# Patient Record
Sex: Female | Born: 1935 | Race: White | Hispanic: No | Smoking: Former smoker
Health system: Southern US, Community
[De-identification: ages and names within clinical notes are randomized; demographics above are authoritative.]

## PROBLEM LIST (undated history)

## (undated) DIAGNOSIS — N179 Acute kidney failure, unspecified: Secondary | ICD-10-CM

## (undated) DIAGNOSIS — N183 Chronic kidney disease, stage 3 unspecified: Secondary | ICD-10-CM

## (undated) DIAGNOSIS — K5790 Diverticulosis of intestine, part unspecified, without perforation or abscess without bleeding: Secondary | ICD-10-CM

## (undated) DIAGNOSIS — D649 Anemia, unspecified: Secondary | ICD-10-CM

## (undated) DIAGNOSIS — K289 Gastrojejunal ulcer, unspecified as acute or chronic, without hemorrhage or perforation: Secondary | ICD-10-CM

## (undated) DIAGNOSIS — K7460 Unspecified cirrhosis of liver: Secondary | ICD-10-CM

## (undated) DIAGNOSIS — K7682 Hepatic encephalopathy: Secondary | ICD-10-CM

## (undated) DIAGNOSIS — Z9289 Personal history of other medical treatment: Secondary | ICD-10-CM

## (undated) DIAGNOSIS — I10 Essential (primary) hypertension: Secondary | ICD-10-CM

## (undated) DIAGNOSIS — K219 Gastro-esophageal reflux disease without esophagitis: Secondary | ICD-10-CM

## (undated) DIAGNOSIS — K7290 Hepatic failure, unspecified without coma: Secondary | ICD-10-CM

## (undated) DIAGNOSIS — E119 Type 2 diabetes mellitus without complications: Secondary | ICD-10-CM

## (undated) DIAGNOSIS — D509 Iron deficiency anemia, unspecified: Secondary | ICD-10-CM

## (undated) DIAGNOSIS — E1142 Type 2 diabetes mellitus with diabetic polyneuropathy: Secondary | ICD-10-CM

## (undated) DIAGNOSIS — K760 Fatty (change of) liver, not elsewhere classified: Secondary | ICD-10-CM

## (undated) DIAGNOSIS — K31819 Angiodysplasia of stomach and duodenum without bleeding: Secondary | ICD-10-CM

## (undated) HISTORY — PX: ABDOMINAL HYSTERECTOMY: SHX81

## (undated) HISTORY — PX: BLADDER SUSPENSION: SHX72

## (undated) HISTORY — PX: CHOLECYSTECTOMY: SHX55

---

## 2011-08-15 ENCOUNTER — Ambulatory Visit (INDEPENDENT_AMBULATORY_CARE_PROVIDER_SITE_OTHER): Payer: PRIVATE HEALTH INSURANCE | Admitting: Ophthalmology

## 2011-08-15 DIAGNOSIS — H35039 Hypertensive retinopathy, unspecified eye: Secondary | ICD-10-CM

## 2011-08-15 DIAGNOSIS — E1139 Type 2 diabetes mellitus with other diabetic ophthalmic complication: Secondary | ICD-10-CM

## 2011-08-15 DIAGNOSIS — H43819 Vitreous degeneration, unspecified eye: Secondary | ICD-10-CM

## 2011-08-15 DIAGNOSIS — E11319 Type 2 diabetes mellitus with unspecified diabetic retinopathy without macular edema: Secondary | ICD-10-CM

## 2011-08-15 DIAGNOSIS — I10 Essential (primary) hypertension: Secondary | ICD-10-CM

## 2011-09-12 DIAGNOSIS — K760 Fatty (change of) liver, not elsewhere classified: Secondary | ICD-10-CM | POA: Insufficient documentation

## 2011-09-12 DIAGNOSIS — E785 Hyperlipidemia, unspecified: Secondary | ICD-10-CM | POA: Insufficient documentation

## 2011-09-12 DIAGNOSIS — I10 Essential (primary) hypertension: Secondary | ICD-10-CM | POA: Diagnosis present

## 2012-08-15 ENCOUNTER — Ambulatory Visit (INDEPENDENT_AMBULATORY_CARE_PROVIDER_SITE_OTHER): Payer: PRIVATE HEALTH INSURANCE | Admitting: Ophthalmology

## 2012-11-29 DIAGNOSIS — F4321 Adjustment disorder with depressed mood: Secondary | ICD-10-CM | POA: Diagnosis present

## 2013-06-13 ENCOUNTER — Emergency Department (HOSPITAL_BASED_OUTPATIENT_CLINIC_OR_DEPARTMENT_OTHER): Payer: 59

## 2013-06-13 ENCOUNTER — Encounter (HOSPITAL_BASED_OUTPATIENT_CLINIC_OR_DEPARTMENT_OTHER): Payer: Self-pay | Admitting: Emergency Medicine

## 2013-06-13 ENCOUNTER — Emergency Department (HOSPITAL_BASED_OUTPATIENT_CLINIC_OR_DEPARTMENT_OTHER)
Admission: EM | Admit: 2013-06-13 | Discharge: 2013-06-13 | Disposition: A | Discharge status: Home / Self Care | Payer: 59 | Attending: Emergency Medicine | Admitting: Emergency Medicine

## 2013-06-13 DIAGNOSIS — D649 Anemia, unspecified: Secondary | ICD-10-CM | POA: Insufficient documentation

## 2013-06-13 DIAGNOSIS — J40 Bronchitis, not specified as acute or chronic: Secondary | ICD-10-CM

## 2013-06-13 DIAGNOSIS — N183 Chronic kidney disease, stage 3 unspecified: Secondary | ICD-10-CM | POA: Insufficient documentation

## 2013-06-13 DIAGNOSIS — R05 Cough: Secondary | ICD-10-CM

## 2013-06-13 DIAGNOSIS — Z8719 Personal history of other diseases of the digestive system: Secondary | ICD-10-CM | POA: Insufficient documentation

## 2013-06-13 DIAGNOSIS — Z79899 Other long term (current) drug therapy: Secondary | ICD-10-CM | POA: Insufficient documentation

## 2013-06-13 DIAGNOSIS — I129 Hypertensive chronic kidney disease with stage 1 through stage 4 chronic kidney disease, or unspecified chronic kidney disease: Secondary | ICD-10-CM | POA: Insufficient documentation

## 2013-06-13 DIAGNOSIS — E119 Type 2 diabetes mellitus without complications: Secondary | ICD-10-CM | POA: Insufficient documentation

## 2013-06-13 DIAGNOSIS — R071 Chest pain on breathing: Secondary | ICD-10-CM | POA: Insufficient documentation

## 2013-06-13 DIAGNOSIS — J209 Acute bronchitis, unspecified: Secondary | ICD-10-CM | POA: Insufficient documentation

## 2013-06-13 HISTORY — DX: Fatty (change of) liver, not elsewhere classified: K76.0

## 2013-06-13 HISTORY — DX: Gastrojejunal ulcer, unspecified as acute or chronic, without hemorrhage or perforation: K28.9

## 2013-06-13 HISTORY — DX: Essential (primary) hypertension: I10

## 2013-06-13 HISTORY — DX: Diverticulosis of intestine, part unspecified, without perforation or abscess without bleeding: K57.90

## 2013-06-13 HISTORY — DX: Chronic kidney disease, stage 3 unspecified: N18.30

## 2013-06-13 HISTORY — DX: Anemia, unspecified: D64.9

## 2013-06-13 HISTORY — DX: Personal history of other medical treatment: Z92.89

## 2013-06-13 HISTORY — DX: Type 2 diabetes mellitus without complications: E11.9

## 2013-06-13 HISTORY — DX: Chronic kidney disease, stage 3 (moderate): N18.3

## 2013-06-13 LAB — BASIC METABOLIC PANEL
Calcium: 9.6 mg/dL (ref 8.4–10.5)
Creatinine, Ser: 1.3 mg/dL — ABNORMAL HIGH (ref 0.50–1.10)
GFR calc Af Amer: 45 mL/min — ABNORMAL LOW (ref >90)
GFR calc non Af Amer: 39 mL/min — ABNORMAL LOW (ref >90)
Glucose, Bld: 188 mg/dL — ABNORMAL HIGH (ref 70–99)
Potassium: 4.7 mEq/L (ref 3.5–5.1)
Sodium: 138 mEq/L (ref 135–145)

## 2013-06-13 LAB — CBC
Hemoglobin: 10.3 g/dL — ABNORMAL LOW (ref 12.0–15.0)
MCH: 28.1 pg (ref 26.0–34.0)
MCHC: 31 g/dL (ref 30.0–36.0)
MCV: 90.5 fL (ref 78.0–100.0)
RDW: 17.3 % — ABNORMAL HIGH (ref 11.5–15.5)

## 2013-06-13 MED ORDER — IOHEXOL 350 MG/ML SOLN
100.0000 mL | Freq: Once | INTRAVENOUS | Status: AC | PRN
  Administered 2013-06-13: 100 mL via INTRAVENOUS

## 2013-06-13 MED ORDER — DEXTROMETHORPHAN-GUAIFENESIN 10-100 MG/5ML PO LIQD: 5.0000 mL | Freq: Four times a day (QID) | ORAL | Status: DC | PRN

## 2013-06-13 MED ORDER — SODIUM CHLORIDE 0.9 % IV BOLUS (SEPSIS)
1000.0000 mL | Freq: Once | INTRAVENOUS | Status: AC
  Administered 2013-06-13: 1000 mL via INTRAVENOUS

## 2013-06-13 MED ORDER — DOXYCYCLINE HYCLATE 100 MG PO CAPS: 100.0000 mg | ORAL_CAPSULE | Freq: Two times a day (BID) | ORAL | Status: DC

## 2013-06-13 NOTE — ED Provider Notes (Signed)
CSN: [id number]     Arrival date & time 06/13/13  1546 History  This chart was scribed for Dagmar Hait, MD by Clydene Laming, ED Scribe. This patient was seen in room MH11/MH11 and the patient's care was started at 4:40 PM. Chief Complaint  Patient presents with  . Cough  . Fever  . Pleurisy   Patient is a 78 y.o. female presenting with cough and fever. The history is provided by the patient. No language interpreter was used.  Cough Cough characteristics:  Dry Severity:  Moderate Onset quality:  Gradual Duration:  3 weeks Timing:  Constant Progression:  Unchanged Chronicity:  Recurrent Smoker: no   Relieved by:  Nothing Worsened by:  Nothing tried Ineffective treatments:  Decongestant and cough suppressants Associated symptoms: fever   Risk factors: no recent travel   Fever Associated symptoms: cough    HPI Comments: Cynthia Stanton is a 78 y.o. female who presents to the Emergency Department complaining of a constant cough onset three weeks ago with associated fever and congestion. Pt reports pain in the left rib associated with taking deep breaths onset today. Pt is not coughing up blood.She does not have a known hx of blood clots. She reports a fever everyday after getting out of the hospital 3 weeks ago. Pt checker her temperature today and it showed 99.1. Pt vomited one night two weeks ago. No known hx of blood clot. Pt is using vicks with mild relief.  Pt has hx of gout and was taken off meds and now has pain in the feet.  Pt was seen at [facility] 3 weeks ago to quaternize her stomach for anemia 3 weeks ago. Symptoms began days after.  Past Medical History  Diagnosis Date  . Anemia   . Ulcer of the stomach and intestine   . History of blood transfusion   . Hypertension   . Diabetes mellitus without complication   . Fatty liver   . Kidney disease, chronic, stage III (GFR 30-59 ml/min)   . Diverticulosis    Past Surgical History  Procedure Laterality Date   . Abdominal hysterectomy    . Cholecystectomy    . Bladder suspension     No family history on file. History  Substance Use Topics  . Smoking status: Never Smoker   . Smokeless tobacco: Not on file  . Alcohol Use: Not on file   OB History   Grav Para Term Preterm Abortions TAB SAB Ect Mult Living                 Review of Systems  Constitutional: Positive for fever.  Respiratory: Positive for cough.   All other systems reviewed and are negative.    Allergies  Amlodipine besy-benazepril hcl; Biaxin; Erythromycin; Flagyl; Lansoprazole; Nifedipine; Norvasc; Omeprazole; Phenergan; and Prednisolone  Home Medications   Current Outpatient Rx  Name  Route  Sig  Dispense  Refill  . allopurinol (ZYLOPRIM) 100 MG tablet   Oral   Take 100 mg by mouth daily.         Marland Kitchen ALPRAZolam (XANAX) 0.5 MG tablet   Oral   Take 0.5 mg by mouth 3 (three) times daily as needed for anxiety.         . calcium carbonate (OS-CAL) 600 MG TABS tablet   Oral   Take 600 mg by mouth 2 (two) times daily with a meal.         . chlorthalidone (HYGROTON) 50 MG tablet  Oral   Take 50 mg by mouth daily.         . cholestyramine light (PREVALITE) 4 G packet   Oral   Take 4 g by mouth 2 (two) times daily.         . colchicine 0.6 MG tablet   Oral   Take 0.6 mg by mouth daily.         Marland Kitchen diltiazem (CARDIZEM CD) 180 MG 24 hr capsule   Oral   Take 180 mg by mouth daily.         Marland Kitchen esomeprazole (NEXIUM) 40 MG capsule   Oral   Take 40 mg by mouth daily at 12 noon.         . ferrous fumarate (HEMOCYTE - 106 MG FE) 325 (106 FE) MG TABS tablet   Oral   Take 1 tablet by mouth.         . fluticasone (FLONASE) 50 MCG/ACT nasal spray   Each Nare   Place into both nostrils daily.         Marland Kitchen lactulose (CEPHULAC) 10 G packet   Oral   Take 10 g by mouth 3 (three) times daily.         Marland Kitchen losartan (COZAAR) 100 MG tablet   Oral   Take 100 mg by mouth daily.         . Multiple  Vitamin (MULTIVITAMIN) capsule   Oral   Take 1 capsule by mouth daily.         . nebivolol (BYSTOLIC) 10 MG tablet   Oral   Take 20 mg by mouth daily.         . pravastatin (PRAVACHOL) 80 MG tablet   Oral   Take 80 mg by mouth daily.         . sertraline (ZOLOFT) 100 MG tablet   Oral   Take 100 mg by mouth daily.         Marland Kitchen spironolactone (ALDACTONE) 25 MG tablet   Oral   Take 25 mg by mouth daily.          BP 158/51  Pulse 69  Temp(Src) 98.4 F (36.9 C) (Oral)  Resp 16  Ht 5' 9.5" (1.765 m)  Wt 158 lb (71.668 kg)  BMI 23.01 kg/m2  SpO2 98% Physical Exam  Nursing note and vitals reviewed. Constitutional: She is oriented to person, place, and time. She appears well-developed and well-nourished. No distress.  HENT:  Head: Normocephalic and atraumatic.  Eyes: EOM are normal.  Neck: Normal range of motion.  Cardiovascular: Normal rate, regular rhythm and normal heart sounds.   Pulmonary/Chest: Effort normal and breath sounds normal. No respiratory distress. She has no wheezes. She exhibits no tenderness.  Abdominal: Soft. She exhibits no distension. There is no tenderness.  Musculoskeletal: Normal range of motion.  Neurological: She is alert and oriented to person, place, and time.  Skin: Skin is warm and dry. No rash noted. No pallor.  Psychiatric: She has a normal mood and affect. Judgment normal.    ED Course  Procedures (including critical care time) DIAGNOSTIC STUDIES: Oxygen Saturation is 98% on RA, normal by my interpretation.    COORDINATION OF CARE: 4:45 PM- Discussed treatment plan with pt at bedside. Pt verbalized understanding and agreement with plan.   Labs Review Labs Reviewed  CBC - Abnormal; Notable for the following:    RBC 3.67 (*)    Hemoglobin 10.3 (*)    HCT 33.2 (*)  RDW 17.3 (*)    All other components within normal limits  BASIC METABOLIC PANEL - Abnormal; Notable for the following:    Glucose, Bld 188 (*)    Creatinine,  Ser 1.30 (*)    GFR calc non Af Amer 39 (*)    GFR calc Af Amer 45 (*)    All other components within normal limits   Imaging Review Dg Chest 2 View  06/13/2013   CLINICAL DATA:  78 year old female with cough, left-sided chest pain and fever.  EXAM: CHEST  2 VIEW  COMPARISON:  05/25/2013 and prior chest radiographs dating back to 01/04/2010  FINDINGS: The cardiomediastinal silhouette is unremarkable.  Lungs are clear.  There is no evidence of focal airspace disease, pulmonary edema, suspicious pulmonary nodule/mass, pleural effusion, or pneumothorax. No acute bony abnormalities are identified.  IMPRESSION: No active cardiopulmonary disease.   Electronically Signed   By: Laveda Abbe M.D.   On: 06/13/2013 17:10   Ct Angio Chest Pe W/cm &/or Wo Cm  06/13/2013   CLINICAL DATA:  Cough, fever, congestion.  EXAM: CT ANGIOGRAPHY CHEST WITH CONTRAST  TECHNIQUE: Multidetector CT imaging of the chest was performed using the standard protocol during bolus administration of intravenous contrast. Multiplanar CT image reconstructions including MIPs were obtained to evaluate the vascular anatomy.  CONTRAST:  OMNIPAQUE IOHEXOL 350 MG/ML SOLN  COMPARISON:  None.  FINDINGS: Satisfactory opacification of pulmonary arteries noted, and there is no evidence of pulmonary emboli. Adequate contrast opacification of the thoracic aorta with no evidence of dissection, aneurysm, or stenosis. There is classic 3-vessel brachiocephalic arch anatomy without proximal stenosis. No pleural or pericardial effusion. Patchy coronary and aortic calcifications. No hilar or mediastinal adenopathy. Azygos fissure. No pneumothorax. Lungs clear. Early spondylitic changes in the mid thoracic spine. There is a trace amount of perihepatic ascites. Remainder visualized upper abdomen unremarkable.  Review of the MIP images confirms the above findings.  IMPRESSION: 1. Negative for acute PE or thoracic aortic dissection. 2. Atherosclerosis, including  aortic and coronary artery disease. Please note that although the presence of coronary artery calcium documents the presence of coronary artery disease, the severity of this disease and any potential stenosis cannot be assessed on this non-gated CT examination. Assessment for potential risk factor modification, dietary therapy or pharmacologic therapy may be warranted, if clinically indicated.   Electronically Signed   By: Oley Balm M.D.   On: 06/13/2013 18:31    EKG Interpretation   None       MDM   1. Cough   2. Bronchitis    I personally performed the services described in this documentation, which was scribed in my presence. The recorded information has been reviewed and is accurate.  78 roll female presents with fever, cough pleuritic pain. No documented fevers at home. She's had cough since her EGD with blood transfusion 3 weeks ago out of her original hospital. She states cough has become mildly productive. She states some chills, but no true documented fevers. Here she is afebrile vital signs are stable. He also reports some left-sided chest pain that is worse with deep breathing that began today. On exam, she has no palpable chest tenderness. With recent hospital stay, concern for PE. PE scan is negative. Chest x-ray is normal. Labs are normal. Will treat with doxycycline for bronchitis and given cough medicine. Stable for discharge   Dagmar Hait, MD 06/13/13 2325

## 2013-06-13 NOTE — ED Notes (Signed)
Pt having fever, cough and rib pain with coughing.  Pt states it was going on for about one month.

## 2013-07-04 ENCOUNTER — Other Ambulatory Visit: Payer: Self-pay | Admitting: Internal Medicine

## 2013-08-17 ENCOUNTER — Ambulatory Visit (INDEPENDENT_AMBULATORY_CARE_PROVIDER_SITE_OTHER): Payer: Medicare Other | Admitting: Ophthalmology

## 2013-08-17 DIAGNOSIS — E1165 Type 2 diabetes mellitus with hyperglycemia: Secondary | ICD-10-CM

## 2013-08-17 DIAGNOSIS — D3130 Benign neoplasm of unspecified choroid: Secondary | ICD-10-CM

## 2013-08-17 DIAGNOSIS — E11319 Type 2 diabetes mellitus with unspecified diabetic retinopathy without macular edema: Secondary | ICD-10-CM

## 2013-08-17 DIAGNOSIS — I10 Essential (primary) hypertension: Secondary | ICD-10-CM

## 2013-08-17 DIAGNOSIS — H43819 Vitreous degeneration, unspecified eye: Secondary | ICD-10-CM

## 2013-08-17 DIAGNOSIS — H35039 Hypertensive retinopathy, unspecified eye: Secondary | ICD-10-CM

## 2013-08-17 DIAGNOSIS — E1139 Type 2 diabetes mellitus with other diabetic ophthalmic complication: Secondary | ICD-10-CM

## 2013-10-28 DIAGNOSIS — N183 Chronic kidney disease, stage 3 unspecified: Secondary | ICD-10-CM | POA: Diagnosis present

## 2013-10-28 DIAGNOSIS — R109 Unspecified abdominal pain: Secondary | ICD-10-CM | POA: Diagnosis present

## 2013-10-28 DIAGNOSIS — K31819 Angiodysplasia of stomach and duodenum without bleeding: Secondary | ICD-10-CM | POA: Insufficient documentation

## 2013-10-28 DIAGNOSIS — E1149 Type 2 diabetes mellitus with other diabetic neurological complication: Secondary | ICD-10-CM | POA: Diagnosis present

## 2013-10-28 DIAGNOSIS — I6529 Occlusion and stenosis of unspecified carotid artery: Secondary | ICD-10-CM | POA: Insufficient documentation

## 2014-08-20 ENCOUNTER — Ambulatory Visit (INDEPENDENT_AMBULATORY_CARE_PROVIDER_SITE_OTHER): Payer: Medicare Other | Admitting: Ophthalmology

## 2015-01-24 DIAGNOSIS — D6959 Other secondary thrombocytopenia: Secondary | ICD-10-CM | POA: Diagnosis present

## 2015-01-24 DIAGNOSIS — I480 Paroxysmal atrial fibrillation: Secondary | ICD-10-CM | POA: Insufficient documentation

## 2015-11-28 DIAGNOSIS — E1142 Type 2 diabetes mellitus with diabetic polyneuropathy: Secondary | ICD-10-CM | POA: Diagnosis present

## 2015-12-06 DIAGNOSIS — K7682 Hepatic encephalopathy: Secondary | ICD-10-CM | POA: Insufficient documentation

## 2015-12-06 DIAGNOSIS — K7290 Hepatic failure, unspecified without coma: Secondary | ICD-10-CM | POA: Insufficient documentation

## 2015-12-21 DIAGNOSIS — D509 Iron deficiency anemia, unspecified: Secondary | ICD-10-CM | POA: Diagnosis present

## 2016-04-04 ENCOUNTER — Encounter (HOSPITAL_BASED_OUTPATIENT_CLINIC_OR_DEPARTMENT_OTHER): Payer: Self-pay

## 2016-04-04 ENCOUNTER — Inpatient Hospital Stay (HOSPITAL_BASED_OUTPATIENT_CLINIC_OR_DEPARTMENT_OTHER)
Admission: EM | Admit: 2016-04-04 | Discharge: 2016-04-08 | DRG: 074 | Disposition: A | Discharge status: Home Health | Payer: Medicare Other | Attending: Family Medicine | Admitting: Family Medicine

## 2016-04-04 DIAGNOSIS — Z888 Allergy status to other drugs, medicaments and biological substances status: Secondary | ICD-10-CM

## 2016-04-04 DIAGNOSIS — Z79899 Other long term (current) drug therapy: Secondary | ICD-10-CM

## 2016-04-04 DIAGNOSIS — F32A Depression, unspecified: Secondary | ICD-10-CM | POA: Diagnosis present

## 2016-04-04 DIAGNOSIS — Z882 Allergy status to sulfonamides status: Secondary | ICD-10-CM

## 2016-04-04 DIAGNOSIS — Z833 Family history of diabetes mellitus: Secondary | ICD-10-CM

## 2016-04-04 DIAGNOSIS — Z7984 Long term (current) use of oral hypoglycemic drugs: Secondary | ICD-10-CM

## 2016-04-04 DIAGNOSIS — K3184 Gastroparesis: Secondary | ICD-10-CM | POA: Diagnosis present

## 2016-04-04 DIAGNOSIS — E1149 Type 2 diabetes mellitus with other diabetic neurological complication: Secondary | ICD-10-CM | POA: Diagnosis present

## 2016-04-04 DIAGNOSIS — Z8249 Family history of ischemic heart disease and other diseases of the circulatory system: Secondary | ICD-10-CM

## 2016-04-04 DIAGNOSIS — I10 Essential (primary) hypertension: Secondary | ICD-10-CM | POA: Diagnosis present

## 2016-04-04 DIAGNOSIS — R197 Diarrhea, unspecified: Secondary | ICD-10-CM

## 2016-04-04 DIAGNOSIS — R112 Nausea with vomiting, unspecified: Secondary | ICD-10-CM | POA: Diagnosis present

## 2016-04-04 DIAGNOSIS — N183 Chronic kidney disease, stage 3 unspecified: Secondary | ICD-10-CM | POA: Diagnosis present

## 2016-04-04 DIAGNOSIS — R262 Difficulty in walking, not elsewhere classified: Secondary | ICD-10-CM

## 2016-04-04 DIAGNOSIS — D631 Anemia in chronic kidney disease: Secondary | ICD-10-CM | POA: Diagnosis present

## 2016-04-04 DIAGNOSIS — D6959 Other secondary thrombocytopenia: Secondary | ICD-10-CM | POA: Diagnosis present

## 2016-04-04 DIAGNOSIS — I130 Hypertensive heart and chronic kidney disease with heart failure and stage 1 through stage 4 chronic kidney disease, or unspecified chronic kidney disease: Secondary | ICD-10-CM | POA: Diagnosis present

## 2016-04-04 DIAGNOSIS — K219 Gastro-esophageal reflux disease without esophagitis: Secondary | ICD-10-CM | POA: Diagnosis present

## 2016-04-04 DIAGNOSIS — D509 Iron deficiency anemia, unspecified: Secondary | ICD-10-CM | POA: Diagnosis present

## 2016-04-04 DIAGNOSIS — D731 Hypersplenism: Secondary | ICD-10-CM | POA: Diagnosis present

## 2016-04-04 DIAGNOSIS — E860 Dehydration: Secondary | ICD-10-CM | POA: Diagnosis not present

## 2016-04-04 DIAGNOSIS — K766 Portal hypertension: Secondary | ICD-10-CM | POA: Diagnosis present

## 2016-04-04 DIAGNOSIS — E875 Hyperkalemia: Secondary | ICD-10-CM | POA: Diagnosis present

## 2016-04-04 DIAGNOSIS — K7460 Unspecified cirrhosis of liver: Secondary | ICD-10-CM | POA: Diagnosis present

## 2016-04-04 DIAGNOSIS — Z87891 Personal history of nicotine dependence: Secondary | ICD-10-CM

## 2016-04-04 DIAGNOSIS — F4321 Adjustment disorder with depressed mood: Secondary | ICD-10-CM | POA: Diagnosis present

## 2016-04-04 DIAGNOSIS — E1143 Type 2 diabetes mellitus with diabetic autonomic (poly)neuropathy: Secondary | ICD-10-CM | POA: Diagnosis not present

## 2016-04-04 DIAGNOSIS — R109 Unspecified abdominal pain: Secondary | ICD-10-CM | POA: Diagnosis present

## 2016-04-04 DIAGNOSIS — Z91030 Bee allergy status: Secondary | ICD-10-CM

## 2016-04-04 DIAGNOSIS — F329 Major depressive disorder, single episode, unspecified: Secondary | ICD-10-CM | POA: Diagnosis present

## 2016-04-04 DIAGNOSIS — I5032 Chronic diastolic (congestive) heart failure: Secondary | ICD-10-CM | POA: Diagnosis present

## 2016-04-04 DIAGNOSIS — E1142 Type 2 diabetes mellitus with diabetic polyneuropathy: Secondary | ICD-10-CM | POA: Diagnosis present

## 2016-04-04 HISTORY — DX: Type 2 diabetes mellitus with diabetic polyneuropathy: E11.42

## 2016-04-04 HISTORY — DX: Gastro-esophageal reflux disease without esophagitis: K21.9

## 2016-04-04 HISTORY — DX: Acute kidney failure, unspecified: N17.9

## 2016-04-04 HISTORY — DX: Unspecified cirrhosis of liver: K74.60

## 2016-04-04 HISTORY — DX: Angiodysplasia of stomach and duodenum without bleeding: K31.819

## 2016-04-04 HISTORY — DX: Iron deficiency anemia, unspecified: D50.9

## 2016-04-04 HISTORY — DX: Hepatic failure, unspecified without coma: K72.90

## 2016-04-04 HISTORY — DX: Hepatic encephalopathy: K76.82

## 2016-04-04 HISTORY — DX: Hypomagnesemia: E83.42

## 2016-04-04 LAB — CBC WITH DIFFERENTIAL/PLATELET
Basophils Absolute: 0 10*3/uL (ref 0.0–0.1)
Basophils Relative: 0 %
Eosinophils Absolute: 0.1 10*3/uL (ref 0.0–0.7)
Eosinophils Relative: 1 %
HEMATOCRIT: 30.4 % — AB (ref 36.0–46.0)
HEMOGLOBIN: 10.2 g/dL — AB (ref 12.0–15.0)
Lymphocytes Relative: 7 %
Lymphs Abs: 0.5 10*3/uL — ABNORMAL LOW (ref 0.7–4.0)
MCH: 32 pg (ref 26.0–34.0)
MCHC: 33.6 g/dL (ref 30.0–36.0)
MCV: 95.3 fL (ref 78.0–100.0)
MONOS PCT: 4 %
Monocytes Absolute: 0.3 10*3/uL (ref 0.1–1.0)
NEUTROS ABS: 7 10*3/uL (ref 1.7–7.7)
Neutrophils Relative %: 89 %
Platelets: 104 10*3/uL — ABNORMAL LOW (ref 150–400)
RBC: 3.19 MIL/uL — ABNORMAL LOW (ref 3.87–5.11)
RDW: 15.6 % — ABNORMAL HIGH (ref 11.5–15.5)
WBC: 7.8 10*3/uL (ref 4.0–10.5)

## 2016-04-04 LAB — COMPREHENSIVE METABOLIC PANEL
ALBUMIN: 4.4 g/dL (ref 3.5–5.0)
ALT: 21 U/L (ref 14–54)
AST: 30 U/L (ref 15–41)
Alkaline Phosphatase: 88 U/L (ref 38–126)
Anion gap: 8 (ref 5–15)
BILIRUBIN TOTAL: 0.7 mg/dL (ref 0.3–1.2)
BUN: 55 mg/dL — ABNORMAL HIGH (ref 6–20)
CALCIUM: 10 mg/dL (ref 8.9–10.3)
CO2: 15 mmol/L — ABNORMAL LOW (ref 22–32)
Chloride: 113 mmol/L — ABNORMAL HIGH (ref 101–111)
Creatinine, Ser: 1.94 mg/dL — ABNORMAL HIGH (ref 0.44–1.00)
GFR calc non Af Amer: 23 mL/min — ABNORMAL LOW (ref >60)
GFR, EST AFRICAN AMERICAN: 27 mL/min — AB (ref >60)
GLUCOSE: 190 mg/dL — AB (ref 65–99)
Potassium: 6.1 mmol/L — ABNORMAL HIGH (ref 3.5–5.1)
Sodium: 136 mmol/L (ref 135–145)
Total Protein: 7.5 g/dL (ref 6.5–8.1)

## 2016-04-04 LAB — LIPASE, BLOOD: Lipase: 52 U/L — ABNORMAL HIGH (ref 11–51)

## 2016-04-04 MED ORDER — ONDANSETRON HCL 4 MG/2ML IJ SOLN
4.0000 mg | Freq: Once | INTRAMUSCULAR | Status: AC
  Administered 2016-04-04: 4 mg via INTRAVENOUS
  Filled 2016-04-04: qty 2

## 2016-04-04 MED ORDER — SODIUM CHLORIDE 0.9 % IV BOLUS (SEPSIS)
1000.0000 mL | Freq: Once | INTRAVENOUS | Status: AC
  Administered 2016-04-04: 1000 mL via INTRAVENOUS

## 2016-04-04 NOTE — ED Provider Notes (Signed)
[facility] Provider Note   CSN: [id number] Arrival date & time: 04/04/16  2045  By signing my name below, I, Irene Pap, attest that this documentation has been prepared under the direction and in the presence of Domenic Moras, PA-C. Electronically Signed: Irene Pap, ED Scribe. 04/04/16. 10:43 PM.  History   Chief Complaint Chief Complaint  Patient presents with  . Abdominal Pain   The history is provided by the patient. No language interpreter was used.   HPI Comments: Cynthia Stanton is a 81 y.o. female with a hx of bladder suspension, abdominal hysterectomy, cholecystectomy, DM, HTN, AKI, cirrhosis of the liver, stomach and intestine ulcer, GERD, and diverticulitis who presents to the Emergency Department complaining of gradually worsening, cramping, intermittent, lower abdominal pain onset 11 months ago. Pt had a recurrence of symptoms starting one week ago. Pt reports associated nausea, vomiting x5, and diarrhea. Pt has been seen by multiple specialists for her symptoms over the past year. Pt recently had US performed last week of her stomach. Family states that pt has had low blood counts showing in her lab work and had a transfusion 3 weeks ago. Pt has been taking Zofran for the nausea to no relief. She denies fever, chills, or cough. Pt is allergic to Amlodipine besy-benazepril hcl; Biaxin; Erythromycin; Flagyl; Lansoprazole; Nifedipine; Norvasc; Omeprazole; Phenergan; and Prednisolone. Pt is a former smoker and drinker.   Past Medical History:  Diagnosis Date  . AKI (acute kidney injury) ([place])   . Anemia   . Diabetes mellitus without complication ([place])   . Diabetic polyneuropathy ([place])   . Diverticulosis   . Fatty liver   . Gastric antral vascular ectasia   . GERD (gastroesophageal reflux disease)   . Hepatic encephalopathy ([place])   . History of blood transfusion   . Hypertension   . Hypomagnesemia   . Iron deficiency anemia   . Kidney disease, chronic,  stage III (GFR 30-59 ml/min)   . Liver cirrhosis ([place])   . Ulcer of the stomach and intestine     There are no active problems to display for this patient.   Past Surgical History:  Procedure Laterality Date  . ABDOMINAL HYSTERECTOMY    . BLADDER SUSPENSION    . CHOLECYSTECTOMY      OB History    No data available      Home Medications    Prior to Admission medications   Medication Sig Start Date End Date Taking? Authorizing Provider  buPROPion (WELLBUTRIN XL) 150 MG 24 hr tablet Take 150 mg by mouth daily.   Yes Historical Provider, MD  clobetasol cream (TEMOVATE) [id number] % Apply 1 application topically 2 (two) times daily.   Yes Historical Provider, MD  folic acid (FOLVITE) 1 MG tablet Take 1 mg by mouth daily.   Yes Historical Provider, MD  furosemide (LASIX) 20 MG tablet Take 20 mg by mouth.   Yes Historical Provider, MD  hydrALAZINE (APRESOLINE) 100 MG tablet Take 100 mg by mouth 2 (two) times daily.   Yes Historical Provider, MD  isosorbide mononitrate (IMDUR) 120 MG 24 hr tablet Take 120 mg by mouth daily.   Yes Historical Provider, MD  labetalol (NORMODYNE) 200 MG tablet Take 200 mg by mouth 2 (two) times daily.   Yes Historical Provider, MD  milk thistle 175 MG tablet Take 175 mg by mouth daily.   Yes Historical Provider, MD  ondansetron (ZOFRAN-ODT) 4 MG disintegrating tablet Take 4 mg by mouth every 8 (eight) hours as  needed for nausea or vomiting.   Yes Historical Provider, MD  UNKNOWN TO PATIENT Med list update from [facility] paper work date 03/22/16 that family brought   Yes Historical Provider, MD  allopurinol (ZYLOPRIM) 100 MG tablet Take 100 mg by mouth daily.    Historical Provider, MD  ALPRAZolam Duanne Moron) 0.5 MG tablet Take 0.5 mg by mouth 3 (three) times daily as needed for anxiety.    Historical Provider, MD  calcium carbonate (OS-CAL) 600 MG TABS tablet Take 600 mg by mouth 2 (two) times daily with a meal.    Historical Provider, MD  ferrous fumarate (HEMOCYTE - 106 MG  FE) 325 (106 FE) MG TABS tablet Take 1 tablet by mouth.    Historical Provider, MD  fluticasone (FLONASE) 50 MCG/ACT nasal spray Place into both nostrils daily.    Historical Provider, MD  lactulose (CEPHULAC) 10 G packet Take 10 g by mouth 3 (three) times daily.    Historical Provider, MD  Multiple Vitamin (MULTIVITAMIN) capsule Take 1 capsule by mouth daily.    Historical Provider, MD  pravastatin (PRAVACHOL) 80 MG tablet Take 80 mg by mouth daily.    Historical Provider, MD  sertraline (ZOLOFT) 100 MG tablet Take 100 mg by mouth daily.    Historical Provider, MD  spironolactone (ALDACTONE) 25 MG tablet Take 25 mg by mouth daily.    Historical Provider, MD    Family History No family history on file.  Social History Social History  Substance Use Topics  . Smoking status: Former Research scientist (life sciences)  . Smokeless tobacco: Never Used  . Alcohol use No     Allergies   Amlodipine besy-benazepril hcl; Biaxin [clarithromycin]; Erythromycin; Flagyl [metronidazole]; Lansoprazole; Nifedipine; Norvasc [amlodipine besylate]; Omeprazole; Phenergan [promethazine hcl]; and Prednisolone   Review of Systems Review of Systems  Constitutional: Negative for chills and fever.  Respiratory: Negative for cough and shortness of breath.   Gastrointestinal: Positive for abdominal pain, diarrhea, nausea and vomiting.  All other systems reviewed and are negative.  Physical Exam Updated Vital Signs BP (!) 113/42 (BP Location: Left Arm)   Pulse 100   Temp 97.6 F (36.4 C) (Oral)   Resp 20   Ht 5' 8.5" (1.74 m)   Wt 153 lb 3 oz (69.5 kg)   SpO2 98%   BMI 22.95 kg/m   Physical Exam  Constitutional: She is oriented to person, place, and time. She appears well-developed and well-nourished.  HENT:  Head: Normocephalic and atraumatic.  Mouth/Throat: Uvula is midline. Mucous membranes are dry.  Eyes: EOM are normal. Pupils are equal, round, and reactive to light.  Neck: Normal range of motion. Neck supple.    Cardiovascular: Normal rate, regular rhythm and normal heart sounds.  Exam reveals no gallop and no friction rub.   No murmur heard. Pulmonary/Chest: Effort normal and breath sounds normal. She has no wheezes.  Abdominal: Soft. There is no tenderness.  Musculoskeletal: Normal range of motion. She exhibits no edema.  Equal strength to all 4 extremities with intact distal pulses.  Neurological: She is alert and oriented to person, place, and time. GCS eye subscore is 4. GCS verbal subscore is 5. GCS motor subscore is 6.  Skin: Skin is warm and dry.  Psychiatric: She has a normal mood and affect. Her behavior is normal.  Nursing note and vitals reviewed.  ED Treatments / Results  DIAGNOSTIC STUDIES: Oxygen Saturation is 98% on RA, normal by my interpretation.    COORDINATION OF CARE: 10:42 PM-Discussed treatment plan which  includes labs and IV fluids with pt at bedside and pt agreed to plan.    Labs (all labs ordered are listed, but only abnormal results are displayed) Labs Reviewed - No data to display  EKG  EKG Interpretation None       Radiology Ct Abdomen Pelvis Wo Contrast  Result Date: 04/05/2016 CLINICAL DATA:  Abdominal pain.  Nausea, vomiting, diarrhea. EXAM: CT ABDOMEN AND PELVIS WITHOUT CONTRAST TECHNIQUE: Multidetector CT imaging of the abdomen and pelvis was performed following the standard protocol without IV contrast. COMPARISON:  CT 12/16/2015, PET CT 01/03/2016 FINDINGS: Lower chest: Irregular right lower lobe pulmonary nodule measures 7 x 8 mm, unchanged in size from prior, margins are irregular. No pleural fluid. Hepatobiliary: Nodular hepatic contours consistent with cirrhosis. No evidence of focal lesion allowing for lack contrast. Postcholecystectomy, no biliary dilatation. Pancreas: No ductal dilatation or inflammation. Spleen: Enlarged measuring 16.3 cm craniocaudal. Adrenals/Urinary Tract: No adrenal nodule. No hydronephrosis. Right renal cyst is again seen.  Question of cyst in the upper left kidney, incompletely characterized without contrast. Bladder is decompressed. Stomach/Bowel: Enteric contrast in the distal esophagus. Unchanged prominent gastric mucosa no small bowel inflammation or obstruction. Colonic diverticulosis most prominent in the descending and sigmoid colon without acute diverticulitis post appendectomy. Vascular/Lymphatic: Abdominal atherosclerosis without aneurysm. No evidence of intra-abdominal or pelvic adenopathy. Reproductive: Status post hysterectomy. No adnexal masses. Other: Minimal perihepatic fluid about the right lobe anteriorly, and decreased from prior. Otherwise no ascites. No free air. Musculoskeletal: There are no acute or suspicious osseous abnormalities. Degenerative change in the spine. IMPRESSION: 1. No acute abnormality in the abdomen/pelvis. 2. Cirrhosis with splenomegaly. 3. Colonic diverticulosis without acute diverticulitis. 4. Unchanged size of irregular right lower lobe pulmonary nodule measuring 7 x 8 mm. Recommendations as per prior PET. Electronically Signed   By: Jeb Levering M.D.   On: 04/05/2016 01:33    Procedures Procedures (including critical care time)  Medications Ordered in ED Medications  sodium chloride 0.9 % bolus 1,000 mL (1,000 mLs Intravenous New Bag/Given 04/04/16 2319)  ondansetron (ZOFRAN) injection 4 mg (4 mg Intravenous Given 04/04/16 2319)     Initial Impression / Assessment and Plan / ED Course  I have reviewed the triage vital signs and the nursing notes.  Pertinent labs & imaging results that were available during my care of the patient were reviewed by me and considered in my medical decision making (see chart for details).  Clinical Course   BP 146/62   Pulse 87   Temp 97.6 F (36.4 C) (Oral)   Resp 26   Ht 5' 8.5" (1.74 m)   Wt 69.5 kg   SpO2 99%   BMI 22.95 kg/m   Patient is nontoxic, nonseptic appearing, in no apparent distress.  Patient's pain and other  symptoms adequately managed in emergency department.  Fluid bolus given. Labs, imaging and vitals reviewed.  She does have mildly elevated K+ of 6.1, suspect hemolysis of blood.  EKG without changes.  Evidence of renal insufficiency, likely chronic since pt report her kidney doctor told her that her kidney function has been impaired.  However, given n/v/d, IVF given, will obtain non contrast abd/pelvis CT scan.  Care discussed with Dr. Florina Ou  1:05 AM ua without evidence of UTI, no ketone in urine.  Hgb 10.2, no leukocytosis.  Mildly elevated lipase of 52. Pt is afebrile, VSS.     1:46 AM CT of abd/pelvis without acute finding.  Pt however report she is fatigue, and doesn't  feel comfortable going home.  She lives by herself.  Pt request to be admitted.  Given her age, evidence of AKI, and pt is dehydrated, I have consulted hospitalist from [facility], Dr. Willodean Rosenthal who agrees to accept pt for admission.  He report that nurse supervisor will call once a bed is available.  Care discussed with Dr. Florina Ou.    Final Clinical Impressions(s) / ED Diagnoses   Final diagnoses:  Dehydration  Nausea vomiting and diarrhea  I personally performed the services described in this documentation, which was scribed in my presence. The recorded information has been reviewed and is accurate.     New Prescriptions New Prescriptions   No medications on file     Domenic Moras, PA-C 04/05/16 1958

## 2016-04-04 NOTE — ED Notes (Signed)
C/o n/v/d x 1 day,  But states she has this 2-3 times per week over last 11 months,  Has been seen for same several times,  Having lower abd pain

## 2016-04-04 NOTE — ED Triage Notes (Signed)
C/o abd cramping, n/v/d intermittent x "months"-was assisted from car to w/c to tx area

## 2016-04-05 ENCOUNTER — Emergency Department (HOSPITAL_BASED_OUTPATIENT_CLINIC_OR_DEPARTMENT_OTHER): Payer: Medicare Other

## 2016-04-05 ENCOUNTER — Encounter (HOSPITAL_COMMUNITY): Payer: Self-pay | Admitting: *Deleted

## 2016-04-05 DIAGNOSIS — F329 Major depressive disorder, single episode, unspecified: Secondary | ICD-10-CM | POA: Diagnosis present

## 2016-04-05 DIAGNOSIS — E875 Hyperkalemia: Secondary | ICD-10-CM | POA: Diagnosis not present

## 2016-04-05 DIAGNOSIS — R112 Nausea with vomiting, unspecified: Secondary | ICD-10-CM | POA: Diagnosis present

## 2016-04-05 DIAGNOSIS — R1030 Lower abdominal pain, unspecified: Secondary | ICD-10-CM | POA: Diagnosis not present

## 2016-04-05 DIAGNOSIS — K7460 Unspecified cirrhosis of liver: Secondary | ICD-10-CM | POA: Diagnosis present

## 2016-04-05 DIAGNOSIS — F32A Depression, unspecified: Secondary | ICD-10-CM | POA: Diagnosis present

## 2016-04-05 DIAGNOSIS — R197 Diarrhea, unspecified: Secondary | ICD-10-CM

## 2016-04-05 DIAGNOSIS — F4321 Adjustment disorder with depressed mood: Secondary | ICD-10-CM | POA: Diagnosis not present

## 2016-04-05 DIAGNOSIS — I5032 Chronic diastolic (congestive) heart failure: Secondary | ICD-10-CM | POA: Diagnosis present

## 2016-04-05 DIAGNOSIS — N183 Chronic kidney disease, stage 3 (moderate): Secondary | ICD-10-CM | POA: Diagnosis not present

## 2016-04-05 LAB — GASTROINTESTINAL PANEL BY PCR, STOOL (REPLACES STOOL CULTURE)
ADENOVIRUS F40/41: NOT DETECTED
ASTROVIRUS: NOT DETECTED
CAMPYLOBACTER SPECIES: NOT DETECTED
Cryptosporidium: NOT DETECTED
Cyclospora cayetanensis: NOT DETECTED
E. coli O157: NOT DETECTED
ENTEROPATHOGENIC E COLI (EPEC): NOT DETECTED
ENTEROTOXIGENIC E COLI (ETEC): NOT DETECTED
Entamoeba histolytica: NOT DETECTED
Enteroaggregative E coli (EAEC): NOT DETECTED
Giardia lamblia: NOT DETECTED
Norovirus GI/GII: NOT DETECTED
PLESIMONAS SHIGELLOIDES: NOT DETECTED
ROTAVIRUS A: NOT DETECTED
SHIGA LIKE TOXIN PRODUCING E COLI (STEC): NOT DETECTED
Salmonella species: NOT DETECTED
Sapovirus (I, II, IV, and V): NOT DETECTED
Shigella/Enteroinvasive E coli (EIEC): NOT DETECTED
VIBRIO SPECIES: NOT DETECTED
Vibrio cholerae: NOT DETECTED
Yersinia enterocolitica: NOT DETECTED

## 2016-04-05 LAB — CBC WITH DIFFERENTIAL/PLATELET
BASOS PCT: 0 %
Basophils Absolute: 0 10*3/uL (ref 0.0–0.1)
EOS ABS: 0.1 10*3/uL (ref 0.0–0.7)
Eosinophils Relative: 1 %
HCT: 27.3 % — ABNORMAL LOW (ref 36.0–46.0)
HEMOGLOBIN: 9.1 g/dL — AB (ref 12.0–15.0)
LYMPHS ABS: 0.8 10*3/uL (ref 0.7–4.0)
LYMPHS PCT: 14 %
MCH: 31.5 pg (ref 26.0–34.0)
MCHC: 33.3 g/dL (ref 30.0–36.0)
MCV: 94.5 fL (ref 78.0–100.0)
Monocytes Absolute: 0.5 10*3/uL (ref 0.1–1.0)
Monocytes Relative: 9 %
NEUTROS ABS: 4.5 10*3/uL (ref 1.7–7.7)
Neutrophils Relative %: 76 %
Platelets: 83 10*3/uL — ABNORMAL LOW (ref 150–400)
RBC: 2.89 MIL/uL — ABNORMAL LOW (ref 3.87–5.11)
RDW: 16.1 % — ABNORMAL HIGH (ref 11.5–15.5)
WBC: 5.9 10*3/uL (ref 4.0–10.5)

## 2016-04-05 LAB — BASIC METABOLIC PANEL
ANION GAP: 6 (ref 5–15)
ANION GAP: 7 (ref 5–15)
BUN: 52 mg/dL — AB (ref 6–20)
BUN: 55 mg/dL — ABNORMAL HIGH (ref 6–20)
CALCIUM: 9.5 mg/dL (ref 8.9–10.3)
CALCIUM: 9.2 mg/dL (ref 8.9–10.3)
CO2: 13 mmol/L — AB (ref 22–32)
CO2: 13 mmol/L — ABNORMAL LOW (ref 22–32)
CREATININE: 1.8 mg/dL — AB (ref 0.44–1.00)
CREATININE: 1.99 mg/dL — AB (ref 0.44–1.00)
Chloride: 118 mmol/L — ABNORMAL HIGH (ref 101–111)
Chloride: 116 mmol/L — ABNORMAL HIGH (ref 101–111)
GFR calc Af Amer: 30 mL/min — ABNORMAL LOW (ref >60)
GFR calc non Af Amer: 26 mL/min — ABNORMAL LOW (ref >60)
GFR, EST AFRICAN AMERICAN: 26 mL/min — AB (ref >60)
GFR, EST NON AFRICAN AMERICAN: 23 mL/min — AB (ref >60)
GLUCOSE: 136 mg/dL — AB (ref 65–99)
GLUCOSE: 217 mg/dL — AB (ref 65–99)
Potassium: 5.4 mmol/L — ABNORMAL HIGH (ref 3.5–5.1)
Potassium: 4.8 mmol/L (ref 3.5–5.1)
Sodium: 137 mmol/L (ref 135–145)
Sodium: 136 mmol/L (ref 135–145)

## 2016-04-05 LAB — URINALYSIS, ROUTINE W REFLEX MICROSCOPIC
Glucose, UA: NEGATIVE mg/dL
Hgb urine dipstick: NEGATIVE
Ketones, ur: NEGATIVE mg/dL
LEUKOCYTES UA: NEGATIVE
NITRITE: NEGATIVE
PROTEIN: 100 mg/dL — AB
SPECIFIC GRAVITY, URINE: 1.017 (ref 1.005–1.030)
pH: 5 (ref 5.0–8.0)

## 2016-04-05 LAB — URINE MICROSCOPIC-ADD ON: WBC UA: NONE SEEN WBC/hpf (ref 0–5)

## 2016-04-05 LAB — AMMONIA: AMMONIA: 53 umol/L — AB (ref 9–35)

## 2016-04-05 LAB — GLUCOSE, CAPILLARY: Glucose-Capillary: 119 mg/dL — ABNORMAL HIGH (ref 65–99)

## 2016-04-05 MED ORDER — ISOSORBIDE MONONITRATE ER 60 MG PO TB24
120.0000 mg | ORAL_TABLET | Freq: Every day | ORAL | Status: DC
  Administered 2016-04-05 – 2016-04-08 (×4): 120 mg via ORAL
  Filled 2016-04-05 (×4): qty 2

## 2016-04-05 MED ORDER — PRAVASTATIN SODIUM 40 MG PO TABS
80.0000 mg | ORAL_TABLET | Freq: Every day | ORAL | Status: DC
  Administered 2016-04-05 – 2016-04-08 (×4): 80 mg via ORAL
  Filled 2016-04-05 (×4): qty 2

## 2016-04-05 MED ORDER — ENSURE ENLIVE PO LIQD
237.0000 mL | Freq: Two times a day (BID) | ORAL | Status: DC
  Administered 2016-04-05 – 2016-04-08 (×2): 237 mL via ORAL

## 2016-04-05 MED ORDER — SODIUM CHLORIDE 0.9 % IV SOLN
INTRAVENOUS | Status: AC
  Administered 2016-04-05: 1000 mL via INTRAVENOUS

## 2016-04-05 MED ORDER — ONDANSETRON HCL 4 MG/2ML IJ SOLN: 4.0000 mg | Freq: Three times a day (TID) | INTRAMUSCULAR | Status: DC | PRN

## 2016-04-05 MED ORDER — ONDANSETRON HCL 4 MG/2ML IJ SOLN
4.0000 mg | Freq: Four times a day (QID) | INTRAMUSCULAR | Status: DC | PRN
  Administered 2016-04-05: 4 mg via INTRAVENOUS
  Filled 2016-04-05 (×2): qty 2

## 2016-04-05 MED ORDER — LABETALOL HCL 200 MG PO TABS
400.0000 mg | ORAL_TABLET | Freq: Two times a day (BID) | ORAL | Status: DC
  Administered 2016-04-05 – 2016-04-08 (×6): 400 mg via ORAL
  Filled 2016-04-05 (×7): qty 2

## 2016-04-05 MED ORDER — INSULIN ASPART 100 UNIT/ML ~~LOC~~ SOLN
0.0000 [IU] | Freq: Three times a day (TID) | SUBCUTANEOUS | Status: DC
  Administered 2016-04-06: 2 [IU] via SUBCUTANEOUS
  Administered 2016-04-07 – 2016-04-08 (×4): 1 [IU] via SUBCUTANEOUS

## 2016-04-05 MED ORDER — ONDANSETRON HCL 4 MG PO TABS
4.0000 mg | ORAL_TABLET | Freq: Four times a day (QID) | ORAL | Status: DC | PRN
Start: 2016-04-05 — End: 2016-04-06

## 2016-04-05 MED ORDER — SODIUM CHLORIDE 0.9 % IV BOLUS (SEPSIS)
1000.0000 mL | Freq: Once | INTRAVENOUS | Status: AC
  Administered 2016-04-05: 1000 mL via INTRAVENOUS

## 2016-04-05 MED ORDER — HYDRALAZINE HCL 50 MG PO TABS
100.0000 mg | ORAL_TABLET | Freq: Three times a day (TID) | ORAL | Status: DC
  Administered 2016-04-05 – 2016-04-08 (×10): 100 mg via ORAL
  Filled 2016-04-05 (×10): qty 2

## 2016-04-05 MED ORDER — ALLOPURINOL 300 MG PO TABS
300.0000 mg | ORAL_TABLET | Freq: Every day | ORAL | Status: DC
  Administered 2016-04-05: 300 mg via ORAL
  Filled 2016-04-05: qty 1

## 2016-04-05 MED ORDER — PANTOPRAZOLE SODIUM 40 MG PO TBEC
40.0000 mg | DELAYED_RELEASE_TABLET | Freq: Two times a day (BID) | ORAL | Status: DC
  Administered 2016-04-05 – 2016-04-08 (×7): 40 mg via ORAL
  Filled 2016-04-05 (×7): qty 1

## 2016-04-05 MED ORDER — FOLIC ACID 1 MG PO TABS
1.0000 mg | ORAL_TABLET | Freq: Every day | ORAL | Status: DC
  Administered 2016-04-05 – 2016-04-08 (×4): 1 mg via ORAL
  Filled 2016-04-05 (×4): qty 1

## 2016-04-05 MED ORDER — BUPROPION HCL ER (XL) 150 MG PO TB24
150.0000 mg | ORAL_TABLET | Freq: Every day | ORAL | Status: DC
  Administered 2016-04-05 – 2016-04-08 (×4): 150 mg via ORAL
  Filled 2016-04-05 (×4): qty 1

## 2016-04-05 MED ORDER — ALPRAZOLAM 0.25 MG PO TABS: 0.2500 mg | ORAL_TABLET | Freq: Every evening | ORAL | Status: DC | PRN

## 2016-04-05 MED ORDER — FUROSEMIDE 10 MG/ML IJ SOLN
40.0000 mg | Freq: Once | INTRAMUSCULAR | Status: AC
  Administered 2016-04-05: 40 mg via INTRAVENOUS
  Filled 2016-04-05: qty 4

## 2016-04-05 MED ORDER — SERTRALINE HCL 50 MG PO TABS
100.0000 mg | ORAL_TABLET | Freq: Every day | ORAL | Status: DC
  Administered 2016-04-05 – 2016-04-08 (×4): 100 mg via ORAL
  Filled 2016-04-05 (×4): qty 2

## 2016-04-05 MED ORDER — INSULIN ASPART 100 UNIT/ML ~~LOC~~ SOLN: 0.0000 [IU] | Freq: Every day | SUBCUTANEOUS | Status: DC

## 2016-04-05 MED ORDER — BOOST / RESOURCE BREEZE PO LIQD: 1.0000 | Freq: Every day | ORAL | Status: DC | PRN

## 2016-04-05 MED ORDER — LACTULOSE 10 GM/15ML PO SOLN
10.0000 g | Freq: Three times a day (TID) | ORAL | Status: DC
  Administered 2016-04-05 (×3): 10 g via ORAL
  Filled 2016-04-05 (×3): qty 15

## 2016-04-05 NOTE — ED Provider Notes (Addendum)
Nursing notes and vitals signs, including pulse oximetry, reviewed.  Summary of this visit's results, reviewed by myself:  EKG:  EKG Interpretation  Date/Time:  Wednesday April 04 2016 23:51:17 EDT Ventricular Rate:  87 PR Interval:    QRS Duration: 137 QT Interval:  391 QTC Calculation: 471 R Axis:   35 Text Interpretation:  Sinus rhythm Prolonged PR interval Nonspecific intraventricular conduction delay Minimal ST depression, inferior leads No previous ECGs available Confirmed by Makaela Cando  MD, Jenny Reichmann ([id number]) on 04/05/2016 12:03:38 AM       Labs:  Results for orders placed or performed during the hospital encounter of 04/04/16 (from the past 24 hour(s))  CBC with Differential/Platelet     Status: Abnormal   Collection Time: 04/04/16  8:05 PM  Result Value Ref Range   WBC 7.8 4.0 - 10.5 K/uL   RBC 3.19 (L) 3.87 - 5.11 MIL/uL   Hemoglobin 10.2 (L) 12.0 - 15.0 g/dL   HCT 30.4 (L) 36.0 - 46.0 %   MCV 95.3 78.0 - 100.0 fL   MCH 32.0 26.0 - 34.0 pg   MCHC 33.6 30.0 - 36.0 g/dL   RDW 15.6 (H) 11.5 - 15.5 %   Platelets 104 (L) 150 - 400 K/uL   Neutrophils Relative % 89 %   Neutro Abs 7.0 1.7 - 7.7 K/uL   Lymphocytes Relative 7 %   Lymphs Abs 0.5 (L) 0.7 - 4.0 K/uL   Monocytes Relative 4 %   Monocytes Absolute 0.3 0.1 - 1.0 K/uL   Eosinophils Relative 1 %   Eosinophils Absolute 0.1 0.0 - 0.7 K/uL   Basophils Relative 0 %   Basophils Absolute 0.0 0.0 - 0.1 K/uL  Comprehensive metabolic panel     Status: Abnormal   Collection Time: 04/04/16  8:05 PM  Result Value Ref Range   Sodium 136 135 - 145 mmol/L   Potassium 6.1 (H) 3.5 - 5.1 mmol/L   Chloride 113 (H) 101 - 111 mmol/L   CO2 15 (L) 22 - 32 mmol/L   Glucose, Bld 190 (H) 65 - 99 mg/dL   BUN 55 (H) 6 - 20 mg/dL   Creatinine, Ser 1.94 (H) 0.44 - 1.00 mg/dL   Calcium 10.0 8.9 - 10.3 mg/dL   Total Protein 7.5 6.5 - 8.1 g/dL   Albumin 4.4 3.5 - 5.0 g/dL   AST 30 15 - 41 U/L   ALT 21 14 - 54 U/L   Alkaline Phosphatase 88 38  - 126 U/L   Total Bilirubin 0.7 0.3 - 1.2 mg/dL   GFR calc non Af Amer 23 (L) >60 mL/min   GFR calc Af Amer 27 (L) >60 mL/min   Anion gap 8 5 - 15  Lipase, blood     Status: Abnormal   Collection Time: 04/04/16  8:05 PM  Result Value Ref Range   Lipase 52 (H) 11 - 51 U/L  Urinalysis, Routine w reflex microscopic (not at [facility])     Status: Abnormal   Collection Time: 04/05/16 12:10 AM  Result Value Ref Range   Color, Urine YELLOW YELLOW   APPearance CLOUDY (A) CLEAR   Specific Gravity, Urine 1.017 1.005 - 1.030   pH 5.0 5.0 - 8.0   Glucose, UA NEGATIVE NEGATIVE mg/dL   Hgb urine dipstick NEGATIVE NEGATIVE   Bilirubin Urine SMALL (A) NEGATIVE   Ketones, ur NEGATIVE NEGATIVE mg/dL   Protein, ur 100 (A) NEGATIVE mg/dL   Nitrite NEGATIVE NEGATIVE   Leukocytes, UA NEGATIVE NEGATIVE  Urine microscopic-add on     Status: Abnormal   Collection Time: 04/05/16 12:10 AM  Result Value Ref Range   Squamous Epithelial / LPF 0-5 (A) NONE SEEN   WBC, UA NONE SEEN 0 - 5 WBC/hpf   RBC / HPF 0-5 0 - 5 RBC/hpf   Bacteria, UA RARE (A) NONE SEEN   Casts HYALINE CASTS (A) NEGATIVE    Imaging Studies: Ct Abdomen Pelvis Wo Contrast  Result Date: 04/05/2016 CLINICAL DATA:  Abdominal pain.  Nausea, vomiting, diarrhea. EXAM: CT ABDOMEN AND PELVIS WITHOUT CONTRAST TECHNIQUE: Multidetector CT imaging of the abdomen and pelvis was performed following the standard protocol without IV contrast. COMPARISON:  CT 12/16/2015, PET CT 01/03/2016 FINDINGS: Lower chest: Irregular right lower lobe pulmonary nodule measures 7 x 8 mm, unchanged in size from prior, margins are irregular. No pleural fluid. Hepatobiliary: Nodular hepatic contours consistent with cirrhosis. No evidence of focal lesion allowing for lack contrast. Postcholecystectomy, no biliary dilatation. Pancreas: No ductal dilatation or inflammation. Spleen: Enlarged measuring 16.3 cm craniocaudal. Adrenals/Urinary Tract: No adrenal nodule. No hydronephrosis.  Right renal cyst is again seen. Question of cyst in the upper left kidney, incompletely characterized without contrast. Bladder is decompressed. Stomach/Bowel: Enteric contrast in the distal esophagus. Unchanged prominent gastric mucosa no small bowel inflammation or obstruction. Colonic diverticulosis most prominent in the descending and sigmoid colon without acute diverticulitis post appendectomy. Vascular/Lymphatic: Abdominal atherosclerosis without aneurysm. No evidence of intra-abdominal or pelvic adenopathy. Reproductive: Status post hysterectomy. No adnexal masses. Other: Minimal perihepatic fluid about the right lobe anteriorly, and decreased from prior. Otherwise no ascites. No free air. Musculoskeletal: There are no acute or suspicious osseous abnormalities. Degenerative change in the spine. IMPRESSION: 1. No acute abnormality in the abdomen/pelvis. 2. Cirrhosis with splenomegaly. 3. Colonic diverticulosis without acute diverticulitis. 4. Unchanged size of irregular right lower lobe pulmonary nodule measuring 7 x 8 mm. Recommendations as per prior PET. Electronically Signed   By: Jeb Levering M.D.   On: 04/05/2016 01:33   3:32 AM Patient awake and alert. She has been able to eat and drink without further emesis. Her care was discussed with Dr. Loleta Books who agrees to admit her to [facility].   Medical screening examination/treatment/procedure(s) were conducted as a shared visit with non-physician practitioner(s) and myself.  I personally evaluated the patient during the encounter.   EKG Interpretation  Date/Time:  Wednesday April 04 2016 23:51:17 EDT Ventricular Rate:  87 PR Interval:    QRS Duration: 137 QT Interval:  391 QTC Calculation: 471 R Axis:   35 Text Interpretation:  Sinus rhythm Prolonged PR interval Nonspecific intraventricular conduction delay Minimal ST depression, inferior leads No previous ECGs available Confirmed by Florina Ou  MD, Jenny Reichmann ([id number]) on 04/05/2016 12:03:38 AM          Shanon Rosser, MD 04/05/16 [place], MD 04/06/16 [id number]

## 2016-04-05 NOTE — Progress Notes (Signed)
04/04/2016  8:50 PM  04/05/2016 10:42 AM  Cynthia Stanton was seen and examined.  The H&P by the admitting provider , orders, imaging was reviewed.  Please see orders.  Will continue to follow.   Murvin Natal, MD [facility]

## 2016-04-05 NOTE — Care Management Note (Signed)
Case Management Note  Patient Details  Name: Cynthia Stanton MRN: [id number] Date of Birth: 12-08-1934  Subjective/Objective:81 y/o f admitted w/hyperkalemia. From home. Per nsg no PT cons needed,ambulating well.                     Action/Plan:d/c plan home.   Expected Discharge Date:                  Expected Discharge Plan:  Home/Self Care  In-House Referral:     Discharge planning Services  CM Consult  Post Acute Care Choice:    Choice offered to:     DME Arranged:    DME Agency:     HH Arranged:    HH Agency:     Status of Service:  In process, will continue to follow  If discussed at Long Length of Stay Meetings, dates discussed:    Additional Comments:  Dessa Phi, RN 04/05/2016, 12:02 PM

## 2016-04-05 NOTE — Progress Notes (Signed)
The patient had an episode of emesis about 1 1/2 hours after eating breakfast and taking her medications. The emesis had food particles and was pink in color r/t pt drinking a strawberry ensure with breakfast. There were whole pills noted in the emesis too. It was an estimated amount of about 250cc. The patient and family reports that the patient has been experiencing vomiting after taking medications for about 1 year. It seems to have worsened over the past few months to the point that it doesn't matter if she has eaten prior to taking medcations or not, she still ends up vomiting. In the last 24 hours, the patient has vomited after each time she has eaten, per the family.   FYI: Pt was recently treated for H-pylori and was on a course of antibiotics for about 7days. The family states that about this time, the MD placed the patient on Omeprazole for 30days- family states that Omeprazole causes nausea for the patient. She is on Pantoprazole in the hospital.  The pt has had two episodes of diarrhea since 7am. It is brown/green in color and watery. Sample sent for GI panel.   Cynthia Stanton [facility] 04/05/2016 12:43 PM

## 2016-04-05 NOTE — Progress Notes (Signed)
Initial Nutrition Assessment  DOCUMENTATION CODES:   Not applicable  INTERVENTION:  - Continue Ensure Enlive BID, each supplement provides 350 kcal and 20 grams of protein - Will order Boost Breeze once/day PRN, this supplement provides 250 kcal and 9 grams of protein. - Continue to encourage PO intakes of meals, beverages, and supplements. - RD will continue to monitor for additional nutrition-related needs.  NUTRITION DIAGNOSIS:   Inadequate oral intake related to acute illness, nausea, vomiting, poor appetite as evidenced by per patient/family report, meal completion < 25%.  GOAL:   Patient will meet greater than or equal to 90% of their needs  MONITOR:   PO intake, Supplement acceptance, Weight trends, Labs, I & O's  REASON FOR ASSESSMENT:   Malnutrition Screening Tool  ASSESSMENT:   81 y.o. 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 Evidently, the patient has had episodes of abdominal pain and vomiting for over a year, maybe a few years. The pain is typically crampy, mild to moderate in intensity, and in different places in the abdomen. It is associated with nausea, decreased appetite and sometimes vomiting, and improves after a relatively short period of time. Now, in the last 3-4 days, she had one of her episodes. The pain is low in her abdomen, "like menstrual cramps", mild to moderate, and initially associated with dry heaves, but then frank vomiting as well as diarrhea. Over the last several days she has not been able to eat anything, and has gotten weaker and weaker.  Pt seen for MST. BMI indicates normal weight. No intakes documented since admission. Visualized breakfast tray with ~25% completion (pancakes, breakfast potatoes, fruit cup, and apple sauce). Pt also drank almost 100%  of strawberry Ensure Enlive and when asked if this made her more nauseated pt states "well it certainly did not make it better." She states that current flare of symptoms began a few weeks ago and symptoms are better some days and worse other days. She states that vomiting and diarrhea PTA were causing decreased intake d/t decreased desire to eat. She states that in the few days PTA she began buying bottled water and was increasing water consumption.  Pt states that she is feeling very sick and feels that if she states still and everything is very quiet she feels she may not feel as sick. Unable to perform physical assessment at this time; no visible muscle or fat wasting at this time. No recent weight hx available in the chart and will need to ask pt about this at follow-up.  Family member at bedside requested RD talk with pt about importance of Ensure. Talked with pt about fluid status, electrolyte losses with ongoing symptoms (although noted that K is high) and poor intakes of macronutrients, including protein, PTA. Talked with pt about Boost Breeze and she states she may be willing to try it later today. Talked with RN about offering this supplement to pt this afternoon.   Not meeting needs PTA. Will continue to monitor for needs during hospitalization. Will continue to monitor POC and findings concerning symptoms.  Medications reviewed; 1 mg folic acid/day, 40 mg IV Lasix x1 dose today, PRN Zofran, 40 mg oral Protonix BID. Labs reviewed; K: 5.4 mmol/L, Cl: 118 mmol/L, BUN: 52 mg/dL, creatinine: 1.8 mg/dL, GFR: 30 mL/min, ammonia: 53 umol/L.  Diet Order:  Diet Carb Modified Fluid consistency: Thin; Room service appropriate? Yes  Skin:  Reviewed, no issues  Last BM:  10/12  Height:   Ht Readings from Last 1 Encounters:  04/05/16 5\' 8"  (1.727 m)    Weight:   Wt Readings from Last 1 Encounters:  04/05/16 157 lb 3.2 oz (71.3 kg)    Ideal Body Weight:  63.64 kg  BMI:  Body mass index  is 23.9 kg/m.  Estimated Nutritional Needs:   Kcal:  1425-1640 (20-23 kcal/kg)  Protein:  57-71 grams (0.8-1 grams/kg)  Fluid:  >/= 1.6 L/day  EDUCATION NEEDS:   No education needs identified at this time    Jarome Matin, MS, RD, LDN Inpatient Clinical Dietitian Pager # [phone] After hours/weekend pager # [phone]

## 2016-04-05 NOTE — Progress Notes (Signed)
81 yo F with CKD baseline Cr 2.2, DM, HTN, and cirrhosis and iron def anemia/pancytopenia who presents with abdominal pain and vomiting.    From review of notes in [place] this is a chronic problem (see Fam Med note from 9/18, see RUQ [place] this past week). Tonight she came in with recurrent vomiting and nausea and abdominal pain and diarrhea.    BP 146/62   Pulse 87   Temp 97.6 F (36.4 C) (Oral)   Resp 26   Ht 5' 8.5" (1.74 m)   Wt 69.5 kg (153 lb 3 oz)   SpO2 99%   BMI 22.95 kg/m    No leukocytosis.  Chronic stable anemia and thrombocytopenia. Cr at baseline, and BUN slightly up.   K is 6.1 and ECG just shows nonspecific IVCD, no peaked T waves.    Got 2L NS and Zofran.  To tele OBS status for hyperkalemia.

## 2016-04-05 NOTE — H&P (Signed)
History and Physical  Patient Name: Cynthia Stanton     EHM:[id number]    DOB: 24-Oct-1934    DOA: 04/04/2016 PCP: Ardith Dark, PA-C  Hematology: Dr. Ronney Lion Reg Hepatology: Dr. Gerald Dexter, [place]    Patient coming from: Home --> [facility]  Chief Complaint: Nausea/vomiting, weakness  HPI: Cynthia Stanton is a 81 y.o. 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  Evidently, the patient has had episodes of abdominal pain and vomiting for over a year, maybe a few years. The pain is typically crampy, mild to moderate in intensity, and in different places in the abdomen.  It is associated with nausea, decreased appetite and sometimes vomiting, and improves after a relatively short period of time.  Per outside records requested in [place], she has been worked up for this in the past, had an EGD in the past (for her cirrhosis, found to have [place] last winter when she was found to have a GIB) and more recently a RUQ [place] that showed only her cirrhosis.  As far as she knows there has been no obvious explanation for her vomiting/cramping episodes, but they resolve by themselves usually.  Now, in the last 3-4 days, she had one of her episodes.  The pain is low in her abdomen, "like menstrual cramps", mild to moderate, and initially associated with dry heaves, but then frank vomiting as well as diarrhea. Over the last several days she has not been able to eat anything, and has gotten weaker and weaker, until today her family member called her to check on her and she couldn't get out of bed, so she brought her to the emergency room. There has been no coffee-ground emesis, no melena, no dysuria, no flank pain, no fever or chills.  ED course: -Afebrile, heart rate 100, respirations 20, blood pressure 113/42, pulse  oximetry normal on room air -Na 136, K 6.1, BUN 55, Cr 1.94 (baseline 1.7-2.3), bicarbonate 15, WBC 7.8 K, Hgb 10.2 (baseline 7-9), platelets 104K (baseline 70-100 K) -Lipase 52 -Urinalysis without pyuria or hematuria, few planned casts -CT of the abdomen and pelvis without contrast showed cirrhosis, known pulmonary nodule (for which she is getting follow-up CT of the chest is coming Monday), gastric reflux, and no evidence of pancreatitis or bowel obstruction -ECG showed sinus rhythm without peaked T waves -She was given 2 L of normal saline and Zofran which improved her symptoms somewhat. -TRH were asked to evaluate for hyperkalemia      Outside records: The patient has cirrhosis from NASH. She was previously followed at [facility] liver clinic, but appears not to have been there in about 4 years.    The patient was admitted in November of last year for GI bleed, EGD showed GAD, treated with APC, transfused, and no further problems.  Over the last several months, it appears that the patient has established with a new nephrologist for her CKD, a new pulmonologist for a pulmonary nodule, a new hematologist for her anemia, and is not yet re-established with a liver specialist.  Regarding her CKD, this appears to be related to HTN and DM, which are long-standing.  Regarding her anemia, this is anemia of iron deficiency (from GAVE?) and also CKD.  She had a bone marrow biopsy this summer that was unremarkable.  With regard to the pulmonary nodule, it  is being followed up soon by her pulmonologist.      ROS: Review of Systems  Constitutional: Positive for malaise/fatigue. Negative for chills, fever and weight loss.  Gastrointestinal: Positive for abdominal pain, diarrhea, nausea and vomiting. Negative for constipation and melena.  Genitourinary: Negative for dysuria, flank pain and urgency.  Neurological: Positive for weakness.  Psychiatric/Behavioral: Positive for memory loss.  All other  systems reviewed and are negative.         Past Medical History:  Diagnosis Date  . AKI (acute kidney injury) ([place])   . Anemia   . Diabetes mellitus without complication ([place])   . Diabetic polyneuropathy ([place])   . Diverticulosis   . Fatty liver   . Gastric antral vascular ectasia   . GERD (gastroesophageal reflux disease)   . Hepatic encephalopathy ([place])   . History of blood transfusion   . Hypertension   . Hypomagnesemia   . Iron deficiency anemia   . Kidney disease, chronic, stage III (GFR 30-59 ml/min)   . Liver cirrhosis ([place])   . Ulcer of the stomach and intestine     Past Surgical History:  Procedure Laterality Date  . ABDOMINAL HYSTERECTOMY    . BLADDER SUSPENSION    . CHOLECYSTECTOMY      Social History: Patient lives alone.  The patient walks unassisted.  She is a former smoker.  She denies history of dementia.  Allergies  Allergen Reactions  . Amlodipine Swelling and Other (See Comments)    Other Reaction: Other reaction Bilateral legs Other Reaction: Other reaction Bilateral legs  . Bee Venom Swelling    LEGS AND ANKLES  . Benazepril Swelling  . Prednisone Shortness Of Breath and Other (See Comments)    Other Reaction: Other reaction Dyspnea, Edema Dyspnea, Edema  . Promethazine Other (See Comments)    Other Reaction: Other reaction Other Reaction: Other reaction Unknown Unknown  . Sulfa Antibiotics Other (See Comments)    Uncoded Allergy. Allergen: Lansoprazole Powd, Other Reaction: Other reaction  . Amlodipine Besy-Benazepril Hcl Other (See Comments)    unknown  . Biaxin [Clarithromycin] Other (See Comments)    unknown  . Erythromycin   . Flagyl [Metronidazole]   . Lansoprazole   . Nifedipine   . Norvasc [Amlodipine Besylate]   . Omeprazole   . Phenergan [Promethazine Hcl]   . Prednisolone   . Omeprazole Magnesium Nausea Only    Family history:  . Diabetes Sister  . Kidney disease Sister  Two sisters Dialysis  . Heart disease  Sister  . Cancer Sister  Sister x1 with breast cancer  . Diabetes Mother  . Hypertension Mother  . Heart disease Father  . Diabetes Brother  . Cancer Brother  Melanoma  . Diabetes Brother  . Heart disease Brother  CABG       Prior to Admission medications   Medication Sig Start Date End Date Taking? Authorizing Provider  buPROPion (WELLBUTRIN XL) 150 MG 24 hr tablet Take 150 mg by mouth daily.   Yes Historical Provider, MD  clobetasol cream (TEMOVATE) 5.73 % Apply 1 application topically 2 (two) times daily.   Yes Historical Provider, MD  folic acid (FOLVITE) 1 MG tablet Take 1 mg by mouth daily.   Yes Historical Provider, MD  furosemide (LASIX) 20 MG tablet Take 20 mg by mouth.   Yes Historical Provider, MD  hydrALAZINE (APRESOLINE) 100 MG tablet Take 100 mg by mouth 2 (two) times daily.   Yes Historical Provider, MD  isosorbide mononitrate (IMDUR)  120 MG 24 hr tablet Take 120 mg by mouth daily.   Yes Historical Provider, MD  labetalol (NORMODYNE) 200 MG tablet Take 200 mg by mouth 2 (two) times daily.   Yes Historical Provider, MD  milk thistle 175 MG tablet Take 175 mg by mouth daily.   Yes Historical Provider, MD  ondansetron (ZOFRAN-ODT) 4 MG disintegrating tablet Take 4 mg by mouth every 8 (eight) hours as needed for nausea or vomiting.   Yes Historical Provider, MD  UNKNOWN TO PATIENT Med list update from [facility] paper work date 03/22/16 that family brought   Yes Historical Provider, MD  allopurinol (ZYLOPRIM) 300 MG tablet Take 300 mg by mouth daily. 03/09/16   Historical Provider, MD  ALPRAZolam Duanne Moron) 0.25 MG tablet Take 0.25 mg by mouth daily as needed. 03/09/16   Historical Provider, MD  calcium carbonate (OS-CAL) 600 MG TABS tablet Take 600 mg by mouth 2 (two) times daily with a meal.    Historical Provider, MD  ferrous sulfate 325 (65 FE) MG tablet Take 325 mg by mouth daily.    Historical Provider, MD  fluticasone (FLONASE) 50 MCG/ACT nasal spray Place into both nostrils  daily.    Historical Provider, MD  lactulose (CEPHULAC) 10 G packet Take 10 g by mouth 3 (three) times daily.    Historical Provider, MD  Multiple Vitamin (MULTIVITAMIN) capsule Take 1 capsule by mouth daily.    Historical Provider, MD  omeprazole (PRILOSEC) 20 MG capsule Take 20 mg by mouth 2 (two) times daily. 02/07/16   Historical Provider, MD  pantoprazole (PROTONIX) 40 MG tablet Take 40 mg by mouth 2 (two) times daily. 01/16/16   Historical Provider, MD  pravastatin (PRAVACHOL) 80 MG tablet Take 80 mg by mouth daily.    Historical Provider, MD  sertraline (ZOLOFT) 100 MG tablet Take 100 mg by mouth daily.    Historical Provider, MD  spironolactone (ALDACTONE) 25 MG tablet Take 25 mg by mouth daily.    Historical Provider, MD       Physical Exam: BP (!) 183/53 (BP Location: Left Arm)   Pulse 100   Temp 98.3 F (36.8 C) (Oral)   Resp 20   Ht 5' 8"  (1.727 m)   Wt 71.3 kg (157 lb 3.2 oz)   SpO2 100%   BMI 23.90 kg/m  General appearance: Well-developed, elderly adult female, alert and in no acute distress.   Eyes: Mildly icteric, conjunctiva pink, lids and lashes normal. PERRL.    ENT: No nasal deformity, discharge, epistaxis.  Hearing normal. OP moist without lesions.   Neck: No neck masses.  Trachea midline.  No thyromegaly/tenderness. Lymph: No cervical or supraclavicular lymphadenopathy. Skin: Warm and dry.  No jaundice.  No suspicious rashes or lesions. Cardiac: RRR, nl S1-S2, no murmurs appreciated.  Capillary refill is brisk.  JVP normal.  No LE edema.  Radial and DP pulses 2+ and symmetric. Respiratory: Normal respiratory rate and rhythm.  CTAB without rales or wheezes. Abdomen: Abdomen soft.  No TTP, no epigastric pain at all, no rebound or guarding. No ascites, distension, hepatosplenomegaly.   MSK: No deformities or effusions.  No cyanosis or clubbing. Neuro: Cranial nerves normal.  Sensation intact to light touch. Speech is fluent.  Muscle strength normal and symmetric.   Mild intention tremor.  FTN symmetric.    Psych: Sensorium intact and responding to questions, alert, oriented to person, place and time, attention normal.  Very tangential.  Affect normal.  Judgment and insight appear moderately impaired.  ?  dementia.     Labs on Admission:  I have personally reviewed following labs and imaging studies: CBC:  Recent Labs Lab 04/04/16 2005  WBC 7.8  NEUTROABS 7.0  HGB 10.2*  HCT 30.4*  MCV 95.3  PLT 846*   Basic Metabolic Panel:  Recent Labs Lab 04/04/16 2005  NA 136  K 6.1*  CL 113*  CO2 15*  GLUCOSE 190*  BUN 55*  CREATININE 1.94*  CALCIUM 10.0   GFR: Estimated Creatinine Clearance: 23.7 mL/min (by C-G formula based on SCr of 1.94 mg/dL (H)).  Liver Function Tests:  Recent Labs Lab 04/04/16 2005  AST 30  ALT 21  ALKPHOS 88  BILITOT 0.7  PROT 7.5  ALBUMIN 4.4    Recent Labs Lab 04/04/16 2005  LIPASE 52*   No results for input(s): AMMONIA in the last 168 hours. Coagulation Profile: No results for input(s): INR, PROTIME in the last 168 hours. Cardiac Enzymes: No results for input(s): CKTOTAL, CKMB, CKMBINDEX, TROPONINI in the last 168 hours. BNP (last 3 results) No results for input(s): PROBNP in the last 8760 hours. HbA1C: No results for input(s): HGBA1C in the last 72 hours. CBG: No results for input(s): GLUCAP in the last 168 hours. Lipid Profile: No results for input(s): CHOL, HDL, LDLCALC, TRIG, CHOLHDL, LDLDIRECT in the last 72 hours. Thyroid Function Tests: No results for input(s): TSH, T4TOTAL, FREET4, T3FREE, THYROIDAB in the last 72 hours. Anemia Panel: No results for input(s): VITAMINB12, FOLATE, FERRITIN, TIBC, IRON, RETICCTPCT in the last 72 hours. Sepsis Labs: Invalid input(s): PROCALCITONIN, LACTICIDVEN No results found for this or any previous visit (from the past 240 hour(s)).       Radiological Exams on Admission: Personally reviewed the following report: Ct Abdomen Pelvis Wo  Contrast  Result Date: 04/05/2016 CLINICAL DATA:  Abdominal pain.  Nausea, vomiting, diarrhea. EXAM: CT ABDOMEN AND PELVIS WITHOUT CONTRAST TECHNIQUE: Multidetector CT imaging of the abdomen and pelvis was performed following the standard protocol without IV contrast. COMPARISON:  CT 12/16/2015, PET CT 01/03/2016 FINDINGS: Lower chest: Irregular right lower lobe pulmonary nodule measures 7 x 8 mm, unchanged in size from prior, margins are irregular. No pleural fluid. Hepatobiliary: Nodular hepatic contours consistent with cirrhosis. No evidence of focal lesion allowing for lack contrast. Postcholecystectomy, no biliary dilatation. Pancreas: No ductal dilatation or inflammation. Spleen: Enlarged measuring 16.3 cm craniocaudal. Adrenals/Urinary Tract: No adrenal nodule. No hydronephrosis. Right renal cyst is again seen. Question of cyst in the upper left kidney, incompletely characterized without contrast. Bladder is decompressed. Stomach/Bowel: Enteric contrast in the distal esophagus. Unchanged prominent gastric mucosa no small bowel inflammation or obstruction. Colonic diverticulosis most prominent in the descending and sigmoid colon without acute diverticulitis post appendectomy. Vascular/Lymphatic: Abdominal atherosclerosis without aneurysm. No evidence of intra-abdominal or pelvic adenopathy. Reproductive: Status post hysterectomy. No adnexal masses. Other: Minimal perihepatic fluid about the right lobe anteriorly, and decreased from prior. Otherwise no ascites. No free air. Musculoskeletal: There are no acute or suspicious osseous abnormalities. Degenerative change in the spine. IMPRESSION: 1. No acute abnormality in the abdomen/pelvis. 2. Cirrhosis with splenomegaly. 3. Colonic diverticulosis without acute diverticulitis. 4. Unchanged size of irregular right lower lobe pulmonary nodule measuring 7 x 8 mm. Recommendations as per prior PET. Electronically Signed   By: Jeb Levering M.D.   On: 04/05/2016  01:33    EKG: Independently reviewed. Rate 87, QTc 471, nonspecific IVCD, no ST changes.    Assessment/Plan  1. Hyperkalemia:  Mild, without ECG changes.  In setting  of volume depletion and CKD.   -Fluids already given -Repeat K now, if elevated, start furosemide    2. Nausea/vomiting, abdominal cramps and diarrhea:  Unclear.  The patient seems to say that this is chronic, recurrent and episodic.  Considerations then include gastroparesis possibly, gastritis, hyperammonemia or severe GERD.  Perhaps exacerbated by viral GE.  No reported hematemesis or melena -Check ammonia -Check GI panel  3. CKD III:  Stable.  BUN elevated from baseline, suspect dehydration not GIB.  4. Anemia of renal disease and iron def:  On iron infusions with her Heme-Onc. -Hold oral iron for now  5. NIDDM:  Diet controlled  6. HTN:  -Continue hydralazine, labetalol, Imdur  7. Mood:  -Continue bupropion, Zoloft -Continue alprazolam PRN  8. Cirrhosis of the liver: -Hold furosemide and spironolactone this morning -Continue lactulose -Check ammonia  9. Chronic diastolic CHF: Per chart.  No evidence of fluid overload at present.  10. Thrombocytopenia: From cirrhosis, portal HTN.  Stable.     DVT prophylaxis: SCDs  Code Status: FULL  Family Communication: Friend at bedside  Disposition Plan: Anticipate fluids and check K now.  If K normal and patient able to tolerate oral intake, likely home this evening or early tomorrow. Consults called: None Admission status: OBS, tele At the point of initial evaluation, it is my clinical opinion that admission for OBSERVATION is reasonable and necessary because the patient's presenting complaints in the context of their chronic conditions represent sufficient risk of deterioration or significant morbidity to constitute reasonable grounds for close observation in the hospital setting, but that the patient may be medically stable for discharge from the  hospital within 24 to 48 hours.    Medical decision making: Patient seen at 6:18 AM on 04/05/2016.  The patient was discussed with Dr. Florina Ou.  What exists of the patient's chart and outside records requested via [place] were reviewed in depth and summarized above.  Clinical condition: stable.        Edwin Dada [facility] Pager [phone]

## 2016-04-06 DIAGNOSIS — Z91030 Bee allergy status: Secondary | ICD-10-CM | POA: Diagnosis not present

## 2016-04-06 DIAGNOSIS — K7469 Other cirrhosis of liver: Secondary | ICD-10-CM

## 2016-04-06 DIAGNOSIS — Z888 Allergy status to other drugs, medicaments and biological substances status: Secondary | ICD-10-CM | POA: Diagnosis not present

## 2016-04-06 DIAGNOSIS — K766 Portal hypertension: Secondary | ICD-10-CM | POA: Diagnosis present

## 2016-04-06 DIAGNOSIS — I10 Essential (primary) hypertension: Secondary | ICD-10-CM

## 2016-04-06 DIAGNOSIS — E1143 Type 2 diabetes mellitus with diabetic autonomic (poly)neuropathy: Secondary | ICD-10-CM | POA: Diagnosis present

## 2016-04-06 DIAGNOSIS — E1142 Type 2 diabetes mellitus with diabetic polyneuropathy: Secondary | ICD-10-CM

## 2016-04-06 DIAGNOSIS — R1030 Lower abdominal pain, unspecified: Secondary | ICD-10-CM | POA: Diagnosis not present

## 2016-04-06 DIAGNOSIS — Z79899 Other long term (current) drug therapy: Secondary | ICD-10-CM | POA: Diagnosis not present

## 2016-04-06 DIAGNOSIS — N183 Chronic kidney disease, stage 3 (moderate): Secondary | ICD-10-CM | POA: Diagnosis present

## 2016-04-06 DIAGNOSIS — Z833 Family history of diabetes mellitus: Secondary | ICD-10-CM | POA: Diagnosis not present

## 2016-04-06 DIAGNOSIS — Z87891 Personal history of nicotine dependence: Secondary | ICD-10-CM | POA: Diagnosis not present

## 2016-04-06 DIAGNOSIS — R197 Diarrhea, unspecified: Secondary | ICD-10-CM

## 2016-04-06 DIAGNOSIS — Z8249 Family history of ischemic heart disease and other diseases of the circulatory system: Secondary | ICD-10-CM | POA: Diagnosis not present

## 2016-04-06 DIAGNOSIS — E860 Dehydration: Secondary | ICD-10-CM | POA: Diagnosis present

## 2016-04-06 DIAGNOSIS — I5032 Chronic diastolic (congestive) heart failure: Secondary | ICD-10-CM | POA: Diagnosis present

## 2016-04-06 DIAGNOSIS — Z7984 Long term (current) use of oral hypoglycemic drugs: Secondary | ICD-10-CM | POA: Diagnosis not present

## 2016-04-06 DIAGNOSIS — E875 Hyperkalemia: Secondary | ICD-10-CM | POA: Diagnosis present

## 2016-04-06 DIAGNOSIS — F4321 Adjustment disorder with depressed mood: Secondary | ICD-10-CM | POA: Diagnosis present

## 2016-04-06 DIAGNOSIS — D500 Iron deficiency anemia secondary to blood loss (chronic): Secondary | ICD-10-CM

## 2016-04-06 DIAGNOSIS — D6959 Other secondary thrombocytopenia: Secondary | ICD-10-CM

## 2016-04-06 DIAGNOSIS — K219 Gastro-esophageal reflux disease without esophagitis: Secondary | ICD-10-CM | POA: Diagnosis present

## 2016-04-06 DIAGNOSIS — D509 Iron deficiency anemia, unspecified: Secondary | ICD-10-CM | POA: Diagnosis present

## 2016-04-06 DIAGNOSIS — K3184 Gastroparesis: Secondary | ICD-10-CM | POA: Diagnosis present

## 2016-04-06 DIAGNOSIS — Z882 Allergy status to sulfonamides status: Secondary | ICD-10-CM | POA: Diagnosis not present

## 2016-04-06 DIAGNOSIS — D631 Anemia in chronic kidney disease: Secondary | ICD-10-CM | POA: Diagnosis present

## 2016-04-06 DIAGNOSIS — I130 Hypertensive heart and chronic kidney disease with heart failure and stage 1 through stage 4 chronic kidney disease, or unspecified chronic kidney disease: Secondary | ICD-10-CM | POA: Diagnosis present

## 2016-04-06 DIAGNOSIS — K7460 Unspecified cirrhosis of liver: Secondary | ICD-10-CM | POA: Diagnosis present

## 2016-04-06 DIAGNOSIS — D731 Hypersplenism: Secondary | ICD-10-CM | POA: Diagnosis present

## 2016-04-06 DIAGNOSIS — R112 Nausea with vomiting, unspecified: Secondary | ICD-10-CM | POA: Diagnosis present

## 2016-04-06 LAB — GLUCOSE, CAPILLARY
GLUCOSE-CAPILLARY: 115 mg/dL — AB (ref 65–99)
GLUCOSE-CAPILLARY: 194 mg/dL — AB (ref 65–99)
Glucose-Capillary: 120 mg/dL — ABNORMAL HIGH (ref 65–99)
Glucose-Capillary: 109 mg/dL — ABNORMAL HIGH (ref 65–99)

## 2016-04-06 LAB — BASIC METABOLIC PANEL
ANION GAP: 5 (ref 5–15)
BUN: 59 mg/dL — ABNORMAL HIGH (ref 6–20)
CHLORIDE: 119 mmol/L — AB (ref 101–111)
CO2: 14 mmol/L — ABNORMAL LOW (ref 22–32)
Calcium: 9.2 mg/dL (ref 8.9–10.3)
Creatinine, Ser: 2.65 mg/dL — ABNORMAL HIGH (ref 0.44–1.00)
GFR calc non Af Amer: 16 mL/min — ABNORMAL LOW (ref >60)
GFR, EST AFRICAN AMERICAN: 19 mL/min — AB (ref >60)
Glucose, Bld: 128 mg/dL — ABNORMAL HIGH (ref 65–99)
POTASSIUM: 5.5 mmol/L — AB (ref 3.5–5.1)
SODIUM: 138 mmol/L (ref 135–145)

## 2016-04-06 MED ORDER — ALLOPURINOL 100 MG PO TABS
200.0000 mg | ORAL_TABLET | Freq: Every day | ORAL | Status: DC
  Administered 2016-04-06 – 2016-04-08 (×3): 200 mg via ORAL
  Filled 2016-04-06 (×3): qty 2

## 2016-04-06 MED ORDER — FUROSEMIDE 10 MG/ML IJ SOLN
20.0000 mg | Freq: Once | INTRAMUSCULAR | Status: AC
  Administered 2016-04-06: 20 mg via INTRAVENOUS
  Filled 2016-04-06: qty 2

## 2016-04-06 MED ORDER — ONDANSETRON HCL 4 MG/2ML IJ SOLN
4.0000 mg | Freq: Four times a day (QID) | INTRAMUSCULAR | Status: DC
  Administered 2016-04-06 – 2016-04-08 (×8): 4 mg via INTRAVENOUS
  Filled 2016-04-06 (×8): qty 2

## 2016-04-06 MED ORDER — SACCHAROMYCES BOULARDII 250 MG PO CAPS
250.0000 mg | ORAL_CAPSULE | Freq: Two times a day (BID) | ORAL | Status: DC
  Administered 2016-04-06 – 2016-04-08 (×4): 250 mg via ORAL
  Filled 2016-04-06 (×4): qty 1

## 2016-04-06 MED ORDER — SODIUM CHLORIDE 0.9 % IV SOLN
INTRAVENOUS | Status: DC
  Filled 2016-04-06: qty 150

## 2016-04-06 MED ORDER — STERILE WATER FOR INJECTION IV SOLN
INTRAVENOUS | Status: DC
  Administered 2016-04-06 – 2016-04-07 (×3): via INTRAVENOUS
  Filled 2016-04-06 (×5): qty 850

## 2016-04-06 MED ORDER — SODIUM POLYSTYRENE SULFONATE 15 GM/60ML PO SUSP
15.0000 g | Freq: Once | ORAL | Status: AC
  Administered 2016-04-06: 15 g via ORAL
  Filled 2016-04-06: qty 60

## 2016-04-06 MED ORDER — SODIUM CHLORIDE 0.9 % IV SOLN: INTRAVENOUS | Status: DC

## 2016-04-06 NOTE — Consult Note (Signed)
[facility] Consultation Note  Referring Provider: Dr. Irwin Brakeman [facility]) Primary Care Physician:  Sheral Apley Primary Gastroenterologist:  Dr. Colon Branch (GI MD [facility])  Reason for Consultation:  Nausea and vomiting  HPI: Cynthia Stanton is a 81 y.o. female whom we've been asked to see for nausea and vomiting.  For the past 10+ years, patient has had intermittent troubles with nausea and vomiting.  These issues, however, were fairly mild until about one year ago.  At this time, there's been progressive worsening of symptoms.  She has daily post-prandial nausea and bloating and fullness and vomiting.  Symptoms even worse after consuming pills; sometimes she has no vomiting and nausea after eating, but has fairly predictable problems with vomiting after pills.  Lost about 10 lbs over the past one month.  No dysphagia.  Occasional GERD.  No abdominal pain.  After she has spell of nausea and vomiting, she will then get urgency of liquid stool.  No blood in stool.  Diarrhea not seemingly associated with eating.  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  Has had endoscopies with what sounds like APC treatments of her stomach by Dr. Dorrene German, last about 6 months ago.  She has received periodic transfusions and sees a hematologist for intravenous iron; stopping oral iron did not help her nausea and vomiting.  She also had her last colonoscopy, unknown results, about 6 months ago with Dr. Dorrene German as well.   Past Medical History:  Diagnosis Date  . AKI (acute kidney injury) ([place])   . Anemia   . Diabetes mellitus without complication ([place])   . Diabetic polyneuropathy ([place])   . Diverticulosis   . Fatty liver   . Gastric antral vascular ectasia   . GERD (gastroesophageal reflux disease)   . Hepatic encephalopathy ([place])   . History of blood transfusion   . Hypertension   . Hypomagnesemia   . Iron deficiency anemia   . Kidney disease, chronic,  stage III (GFR 30-59 ml/min)   . Liver cirrhosis ([place])   . Ulcer of the stomach and intestine     Past Surgical History:  Procedure Laterality Date  . ABDOMINAL HYSTERECTOMY    . BLADDER SUSPENSION    . CHOLECYSTECTOMY      Prior to Admission medications   Medication Sig Start Date End Date Taking? Authorizing Provider  allopurinol (ZYLOPRIM) 300 MG tablet Take 300 mg by mouth daily. 03/09/16  Yes Historical Provider, MD  ALPRAZolam Duanne Moron) 0.25 MG tablet Take 0.25 mg by mouth daily as needed. 03/09/16  Yes Historical Provider, MD  buPROPion (WELLBUTRIN XL) 150 MG 24 hr tablet Take 150 mg by mouth daily.   Yes Historical Provider, MD  calcium carbonate (OS-CAL) 600 MG TABS tablet Take 600 mg by mouth 2 (two) times daily with a meal.   Yes Historical Provider, MD  clobetasol cream (TEMOVATE) [id number] % Apply 1 application topically 2 (two) times daily.   Yes Historical Provider, MD  ferrous sulfate 325 (65 FE) MG tablet Take 325 mg by mouth daily.   Yes Historical Provider, MD  fluticasone (FLONASE) 50 MCG/ACT nasal spray Place into both nostrils daily.   Yes Historical Provider, MD  folic acid (FOLVITE) 1 MG tablet Take 1 mg by mouth daily.   Yes Historical Provider, MD  furosemide (LASIX) 20 MG tablet Take 20 mg by mouth.   Yes Historical Provider, MD  hydrALAZINE (APRESOLINE) 100 MG tablet Take 100 mg by mouth every 8 (eight)  hours.    Yes Historical Provider, MD  isosorbide mononitrate (IMDUR) 120 MG 24 hr tablet Take 120 mg by mouth daily.   Yes Historical Provider, MD  labetalol (NORMODYNE) 200 MG tablet Take 400 mg by mouth 2 (two) times daily.    Yes Historical Provider, MD  lactulose (CEPHULAC) 10 G packet Take 10 g by mouth 3 (three) times daily.   Yes Historical Provider, MD  milk thistle 175 MG tablet Take 175 mg by mouth daily.   Yes Historical Provider, MD  Multiple Vitamin (MULTIVITAMIN) capsule Take 1 capsule by mouth daily.   Yes Historical Provider, MD  ondansetron  (ZOFRAN-ODT) 4 MG disintegrating tablet Take 4 mg by mouth every 8 (eight) hours as needed for nausea or vomiting.   Yes Historical Provider, MD  pantoprazole (PROTONIX) 40 MG tablet Take 40 mg by mouth daily.   Yes Historical Provider, MD  pravastatin (PRAVACHOL) 80 MG tablet Take 80 mg by mouth daily.   Yes Historical Provider, MD  sertraline (ZOLOFT) 100 MG tablet Take 100 mg by mouth daily.   Yes Historical Provider, MD  spironolactone (ALDACTONE) 25 MG tablet Take 25 mg by mouth daily.   Yes Historical Provider, MD    Current Facility-Administered Medications  Medication Dose Route Frequency Provider Last Rate Last Dose  . allopurinol (ZYLOPRIM) tablet 200 mg  200 mg Oral Daily Clanford Marisa Hua, MD   200 mg at 04/06/16 1132  . ALPRAZolam (XANAX) tablet 0.25 mg  0.25 mg Oral QHS PRN Edwin Dada, MD      . buPROPion (WELLBUTRIN XL) 24 hr tablet 150 mg  150 mg Oral Daily Edwin Dada, MD   150 mg at 04/06/16 1004  . feeding supplement (BOOST / RESOURCE BREEZE) liquid 1 Container  1 Container Oral Daily PRN Clanford Marisa Hua, MD      . feeding supplement (ENSURE ENLIVE) (ENSURE ENLIVE) liquid 237 mL  237 mL Oral BID BM Edwin Dada, MD   237 mL at 04/05/16 0957  . folic acid (FOLVITE) tablet 1 mg  1 mg Oral Daily Edwin Dada, MD   1 mg at 04/06/16 1132  . hydrALAZINE (APRESOLINE) tablet 100 mg  100 mg Oral Q8H Edwin Dada, MD   100 mg at 04/06/16 1444  . insulin aspart (novoLOG) injection 0-5 Units  0-5 Units Subcutaneous QHS Gardiner Barefoot, NP      . insulin aspart (novoLOG) injection 0-9 Units  0-9 Units Subcutaneous TID WC Gardiner Barefoot, NP   2 Units at 04/06/16 1155  . isosorbide mononitrate (IMDUR) 24 hr tablet 120 mg  120 mg Oral Daily Edwin Dada, MD   120 mg at 04/06/16 1004  . labetalol (NORMODYNE) tablet 400 mg  400 mg Oral BID Edwin Dada, MD   400 mg at 04/06/16 1004  . ondansetron (ZOFRAN) tablet 4  mg  4 mg Oral Q6H PRN Edwin Dada, MD       Or  . ondansetron (ZOFRAN) injection 4 mg  4 mg Intravenous Q6H PRN Edwin Dada, MD   4 mg at 04/05/16 1157  . pantoprazole (PROTONIX) EC tablet 40 mg  40 mg Oral BID Edwin Dada, MD   40 mg at 04/06/16 1132  . pravastatin (PRAVACHOL) tablet 80 mg  80 mg Oral Daily Edwin Dada, MD   80 mg at 04/06/16 1131  . sertraline (ZOLOFT) tablet 100 mg  100 mg Oral Daily [employer],  MD   100 mg at 04/06/16 1004  . sodium bicarbonate 150 mEq in sterile water 1,000 mL infusion   Intravenous Continuous Clanford Marisa Hua, MD 100 mL/hr at 04/06/16 1132      Allergies as of 04/04/2016 - Review Complete 04/04/2016  Allergen Reaction Noted  . Amlodipine besy-benazepril hcl  06/13/2013  . Biaxin [clarithromycin]  06/13/2013  . Erythromycin  06/13/2013  . Flagyl [metronidazole]  06/13/2013  . Lansoprazole  06/13/2013  . Nifedipine  06/13/2013  . Norvasc [amlodipine besylate]  06/13/2013  . Omeprazole  06/13/2013  . Phenergan [promethazine hcl]  06/13/2013  . Prednisolone  06/13/2013    Family History  Problem Relation Age of Onset  . Diabetes Mother   . Kidney disease Sister     two sisters with ESRD    Social History   Social History  . Marital status: Widowed    Spouse name: N/A  . Number of children: N/A  . Years of education: N/A   Occupational History  . Not on file.   Social History Main Topics  . Smoking status: Former Research scientist (life sciences)  . Smokeless tobacco: Never Used  . Alcohol use No  . Drug use: No  . Sexual activity: Not on file   Other Topics Concern  . Not on file   Social History Narrative  . No narrative on file    Review of Systems: ROS Dr. Loleta Books 04/05/16 reviewed and I agree  Physical Exam: Vital signs in last 24 hours: Temp:  [98.2 F (36.8 C)-98.9 F (37.2 C)] 98.6 F (37 C) (10/13 1429) Pulse Rate:  [65-84] 84 (10/13 1429) Resp:  [16-18] 16 (10/13 1429) BP:  (113-133)/(38-61) 113/61 (10/13 1429) SpO2:  [97 %-99 %] 97 % (10/13 1429) Last BM Date: 04/06/16 General:   Alert, overweight, NAD Head:  Normocephalic and atraumatic. Eyes:  Sclera clear, no icterus.   Conjunctiva pink. Ears:  Normal auditory acuity. Nose:  No deformity, discharge,  or lesions. Mouth:  No deformity or lesions.  Oropharynx pink but dry Neck:  Supple; no masses or thyromegaly. Lungs:  Clear throughout to auscultation.   No wheezes, crackles, or rhonchi. No acute distress. Heart:  Regular rate and rhythm; no murmurs, clicks, rubs,  or gallops. Abdomen:  Soft, protuberant, nontender and nondistended. No masses, hepatosplenomegaly or hernias noted. Normal bowel sounds, without guarding, and without rebound.     Msk:  Symmetrical without gross deformities. Normal posture. Pulses:  Normal pulses noted. Extremities:  Without clubbing or edema. Neurologic:  Alert and  oriented x4; diffusely weak, otherwise grossly normal neurologically; no encephalopathy Skin:  Scattered ecchymoses and telangiectasias, otherwise intact without significant lesions or rashes. Psych:  Alert and cooperative. Normal mood and affect.   Lab Results:  Recent Labs  04/04/16 2005 04/05/16 0559  WBC 7.8 5.9  HGB 10.2* 9.1*  HCT 30.4* 27.3*  PLT 104* 83*   BMET  Recent Labs  04/05/16 0559 04/05/16 1258 04/06/16 0537  NA 137 136 138  K 5.4* 4.8 5.5*  CL 118* 116* 119*  CO2 13* 13* 14*  GLUCOSE 136* 217* 128*  BUN 52* 55* 59*  CREATININE 1.80* 1.99* 2.65*  CALCIUM 9.5 9.2 9.2   LFT  Recent Labs  04/04/16 2005  PROT 7.5  ALBUMIN 4.4  AST 30  ALT 21  ALKPHOS 88  BILITOT 0.7   PT/INR No results for input(s): LABPROT, INR in the last 72 hours.  Studies/Results: Ct Abdomen Pelvis Wo Contrast  Result Date: 04/05/2016 CLINICAL  DATA:  Abdominal pain.  Nausea, vomiting, diarrhea. EXAM: CT ABDOMEN AND PELVIS WITHOUT CONTRAST TECHNIQUE: Multidetector CT imaging of the abdomen and  pelvis was performed following the standard protocol without IV contrast. COMPARISON:  CT 12/16/2015, PET CT 01/03/2016 FINDINGS: Lower chest: Irregular right lower lobe pulmonary nodule measures 7 x 8 mm, unchanged in size from prior, margins are irregular. No pleural fluid. Hepatobiliary: Nodular hepatic contours consistent with cirrhosis. No evidence of focal lesion allowing for lack contrast. Postcholecystectomy, no biliary dilatation. Pancreas: No ductal dilatation or inflammation. Spleen: Enlarged measuring 16.3 cm craniocaudal. Adrenals/Urinary Tract: No adrenal nodule. No hydronephrosis. Right renal cyst is again seen. Question of cyst in the upper left kidney, incompletely characterized without contrast. Bladder is decompressed. Stomach/Bowel: Enteric contrast in the distal esophagus. Unchanged prominent gastric mucosa no small bowel inflammation or obstruction. Colonic diverticulosis most prominent in the descending and sigmoid colon without acute diverticulitis post appendectomy. Vascular/Lymphatic: Abdominal atherosclerosis without aneurysm. No evidence of intra-abdominal or pelvic adenopathy. Reproductive: Status post hysterectomy. No adnexal masses. Other: Minimal perihepatic fluid about the right lobe anteriorly, and decreased from prior. Otherwise no ascites. No free air. Musculoskeletal: There are no acute or suspicious osseous abnormalities. Degenerative change in the spine. IMPRESSION: 1. No acute abnormality in the abdomen/pelvis. 2. Cirrhosis with splenomegaly. 3. Colonic diverticulosis without acute diverticulitis. 4. Unchanged size of irregular right lower lobe pulmonary nodule measuring 7 x 8 mm. Recommendations as per prior PET. Electronically Signed   By: Jeb Levering M.D.   On: 04/05/2016 01:33   Impression:  1.  Nausea and vomiting.  May be medication side-effect (symptoms seem almost immediately after taking her medications).  Gastroparesis is another consideration.  Generalized  intestinal dysmotility, potentially related to diabetes, is another thought.  Normal EGD 6 months ago would argue against gastric outlet obstruction.  CT scan recently showed no obstruction. 2.  Diarrhea.  Medication effect versus vagal effect (worse diarrhea once her nausea hits).  Less likely malabsorptive process.  Chronicity of symptoms strongly argues against infectious process. 3.  Cirrhosis, likely NASH-mediated, well-compensated, normal LFTs 4.  Anemia, chronic, likely at least in part due to gastric antral vascular ectasia (GAVE).  No evidence of overt GI tract bleeding. 5.  Acute worsening of chronic renal insufficiency.  Doubt hepatorenal syndrome.  Suspect from hypovolemia.  Plan:  1.  Scheduled ondansetron 4 mg IV every 6 hours for the next 24-48 hours. 2.  Agree with pharmacy to investigate medication to make sure none are strongly associated with nausea. 3.  Florastor 250 mg po bid. 4.  Full liquid diet for now, slowly advance to gastroparesis-type diet (small, frequent meals; minimize use of fibrous breads/meats/vegetables and greasy foods). 5.  Consider gastric emptying study on Monday, if patient is still in hospital. 6.  Obtain outside endoscopy and colonoscopy records from Dr. Dinah Beers office. 7.  [facility] will follow.   LOS: 0 days   Trystyn Sitts M  04/06/2016, 3:54 PM  Pager [phone] If no answer or after 5 PM call [phone]

## 2016-04-06 NOTE — Progress Notes (Signed)
PROGRESS NOTE    Cynthia Stanton  [id number]  DOB: 01/15/1935  DOA: 04/04/2016 PCP: Sheral Apley Outpatient Specialists:   Hospital course: Cynthia Stanton is a 81 y.o. 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  Assessment & Plan:   1. Hyperkalemia:  Improved but still suboptimal in setting of volume depletion and CKD.   -Fluids ordered -Repeat K later today, Repeat lasix IV, kayexalate given  2. Nausea/vomiting, abdominal cramps and diarrhea:  Unclear of true cause, suspect underlying diabetic gastroparesis.  The patient seems to say that this is chronic, recurrent and episodic.  Considerations then include gastroparesis possibly, gastritis, hyperammonemia or severe GERD.    No reported hematemesis or melena -GI panel pending - Given her worsening symptoms I have asked for GI consultation for opinion.  I asked pharmD to review all of her meds to see if there is a possible med that could be contributing.   3. CKD III:  BUN/Cr elevated but at baseline from recent tests from [facility] system reviewed records care everywhere.  Added sodium bicarb drip today.  Repeat labs in AM.  4. Anemia of renal disease and iron def:  On iron infusions with her Heme-Onc. -Hold oral iron for now  5. NIDDM:  Diet controlled  6. HTN:  -Continue hydralazine, labetalol, Imdur  7. Mood:  -Continue bupropion, Zoloft -Continue alprazolam PRN  8. Cirrhosis of the liver: - Pt followed by hepatologist at [facility] and reports that she had been taken off lactulose  9. Chronic diastolic CHF:  Appears compensated at this time, will monitor with ongoing IVFs.   10. Thrombocytopenia: From cirrhosis, portal HTN.  Stable.  DVT prophylaxis: SCDs  Code Status: FULL  Family Communication: neice at  bedside and on phone I spoke with another family member per patient request  Disposition Plan: TBD Consults called: [facility]   Subjective: Pt had severe bout of emesis yesterday.  She says that this was the worst episode that she has had and they continue to worsen.     Objective: Vitals:   04/05/16 0950 04/05/16 1503 04/05/16 2000 04/06/16 0511  BP:  (!) 119/52 (!) 120/44 (!) 121/38  Pulse: 82 77 65 74  Resp:  15 18 18   Temp:  98.6 F (37 C) 98.2 F (36.8 C) 98.5 F (36.9 C)  TempSrc:  Oral Oral Oral  SpO2:  97% 98% 97%  Weight:      Height:        Intake/Output Summary (Last 24 hours) at 04/06/16 0824 Last data filed at 04/05/16 2300  Gross per 24 hour  Intake              840 ml  Output                0 ml  Net              840 ml   Filed Weights   04/04/16 2047 04/05/16 0515  Weight: 69.5 kg (153 lb 3 oz) 71.3 kg (157 lb 3.2 oz)    Exam:  General appearance: Well-developed, elderly adult female, alert and in no acute distress.   Eyes: Mildly icteric, conjunctiva pink, lids and lashes normal. PERRL.    ENT: No nasal deformity, discharge, epistaxis.  Hearing normal. OP moist without lesions.   Neck: No  neck masses.  Trachea midline.  No thyromegaly/tenderness. Lymph: No cervical or supraclavicular lymphadenopathy. Skin: Warm and dry.  No jaundice.  No suspicious rashes or lesions. Cardiac: RRR, nl S1-S2, no murmurs appreciated.  Capillary refill is brisk.  JVP normal.  No LE edema.  Radial and DP pulses 2+ and symmetric. Respiratory: Normal respiratory rate and rhythm.  CTAB without rales or wheezes. Abdomen: Abdomen soft.  No TTP, no epigastric pain at all, no rebound or guarding. No ascites, distension, hepatosplenomegaly.   MSK: No deformities or effusions.  No cyanosis or clubbing. Neuro: Cranial nerves normal.  Sensation intact to light touch. Speech is fluent.  Muscle strength normal and symmetric.  Mild intention tremor. Psych: Sensorium intact and responding  to questions, alert, oriented to person, place and time, attention normal.   Data Reviewed: Basic Metabolic Panel:  Recent Labs Lab 04/04/16 2005 04/05/16 0559 04/05/16 1258 04/06/16 0537  NA 136 137 136 138  K 6.1* 5.4* 4.8 5.5*  CL 113* 118* 116* 119*  CO2 15* 13* 13* 14*  GLUCOSE 190* 136* 217* 128*  BUN 55* 52* 55* 59*  CREATININE 1.94* 1.80* 1.99* 2.65*  CALCIUM 10.0 9.5 9.2 9.2   Liver Function Tests:  Recent Labs Lab 04/04/16 2005  AST 30  ALT 21  ALKPHOS 88  BILITOT 0.7  PROT 7.5  ALBUMIN 4.4    Recent Labs Lab 04/04/16 2005  LIPASE 52*    Recent Labs Lab 04/05/16 0559  AMMONIA 53*   CBC:  Recent Labs Lab 04/04/16 2005 04/05/16 0559  WBC 7.8 5.9  NEUTROABS 7.0 4.5  HGB 10.2* 9.1*  HCT 30.4* 27.3*  MCV 95.3 94.5  PLT 104* 83*   Cardiac Enzymes: No results for input(s): CKTOTAL, CKMB, CKMBINDEX, TROPONINI in the last 168 hours. CBG (last 3)   Recent Labs  04/05/16 2208 04/06/16 0730  GLUCAP 119* 115*   Recent Results (from the past 240 hour(s))  Gastrointestinal Panel by PCR , Stool     Status: None   Collection Time: 04/05/16 10:09 AM  Result Value Ref Range Status   Campylobacter species NOT DETECTED NOT DETECTED Final   Plesimonas shigelloides NOT DETECTED NOT DETECTED Final   Salmonella species NOT DETECTED NOT DETECTED Final   Yersinia enterocolitica NOT DETECTED NOT DETECTED Final   Vibrio species NOT DETECTED NOT DETECTED Final   Vibrio cholerae NOT DETECTED NOT DETECTED Final   Enteroaggregative E coli (EAEC) NOT DETECTED NOT DETECTED Final   Enteropathogenic E coli (EPEC) NOT DETECTED NOT DETECTED Final   Enterotoxigenic E coli (ETEC) NOT DETECTED NOT DETECTED Final   Shiga like toxin producing E coli (STEC) NOT DETECTED NOT DETECTED Final   E. coli O157 NOT DETECTED NOT DETECTED Final   Shigella/Enteroinvasive E coli (EIEC) NOT DETECTED NOT DETECTED Final   Cryptosporidium NOT DETECTED NOT DETECTED Final   Cyclospora  cayetanensis NOT DETECTED NOT DETECTED Final   Entamoeba histolytica NOT DETECTED NOT DETECTED Final   Giardia lamblia NOT DETECTED NOT DETECTED Final   Adenovirus F40/41 NOT DETECTED NOT DETECTED Final   Astrovirus NOT DETECTED NOT DETECTED Final   Norovirus GI/GII NOT DETECTED NOT DETECTED Final   Rotavirus A NOT DETECTED NOT DETECTED Final   Sapovirus (I, II, IV, and V) NOT DETECTED NOT DETECTED Final     Studies: Ct Abdomen Pelvis Wo Contrast  Result Date: 04/05/2016 CLINICAL DATA:  Abdominal pain.  Nausea, vomiting, diarrhea. EXAM: CT ABDOMEN AND PELVIS WITHOUT CONTRAST TECHNIQUE: Multidetector CT imaging of the  abdomen and pelvis was performed following the standard protocol without IV contrast. COMPARISON:  CT 12/16/2015, PET CT 01/03/2016 FINDINGS: Lower chest: Irregular right lower lobe pulmonary nodule measures 7 x 8 mm, unchanged in size from prior, margins are irregular. No pleural fluid. Hepatobiliary: Nodular hepatic contours consistent with cirrhosis. No evidence of focal lesion allowing for lack contrast. Postcholecystectomy, no biliary dilatation. Pancreas: No ductal dilatation or inflammation. Spleen: Enlarged measuring 16.3 cm craniocaudal. Adrenals/Urinary Tract: No adrenal nodule. No hydronephrosis. Right renal cyst is again seen. Question of cyst in the upper left kidney, incompletely characterized without contrast. Bladder is decompressed. Stomach/Bowel: Enteric contrast in the distal esophagus. Unchanged prominent gastric mucosa no small bowel inflammation or obstruction. Colonic diverticulosis most prominent in the descending and sigmoid colon without acute diverticulitis post appendectomy. Vascular/Lymphatic: Abdominal atherosclerosis without aneurysm. No evidence of intra-abdominal or pelvic adenopathy. Reproductive: Status post hysterectomy. No adnexal masses. Other: Minimal perihepatic fluid about the right lobe anteriorly, and decreased from prior. Otherwise no ascites. No  free air. Musculoskeletal: There are no acute or suspicious osseous abnormalities. Degenerative change in the spine. IMPRESSION: 1. No acute abnormality in the abdomen/pelvis. 2. Cirrhosis with splenomegaly. 3. Colonic diverticulosis without acute diverticulitis. 4. Unchanged size of irregular right lower lobe pulmonary nodule measuring 7 x 8 mm. Recommendations as per prior PET. Electronically Signed   By: Jeb Levering M.D.   On: 04/05/2016 01:33   Scheduled Meds: . allopurinol  300 mg Oral Daily  . buPROPion  150 mg Oral Daily  . feeding supplement (ENSURE ENLIVE)  237 mL Oral BID BM  . folic acid  1 mg Oral Daily  . furosemide  20 mg Intravenous Once  . hydrALAZINE  100 mg Oral Q8H  . insulin aspart  0-5 Units Subcutaneous QHS  . insulin aspart  0-9 Units Subcutaneous TID WC  . isosorbide mononitrate  120 mg Oral Daily  . labetalol  400 mg Oral BID  . lactulose  10 g Oral TID  . pantoprazole  40 mg Oral BID  . pravastatin  80 mg Oral Daily  . sertraline  100 mg Oral Daily  . sodium polystyrene  15 g Oral Once   Continuous Infusions:   Principal Problem:   Hyperkalemia Active Problems:   Nausea, vomiting and diarrhea   Abdominal pain   Adjustment disorder with depressed mood   Chronic kidney disease, stage III (moderate)   Cirrhosis of liver (HCC)   Depression   Diabetes mellitus with neurological manifestations (HCC)   Diabetic polyneuropathy associated with type 2 diabetes mellitus (HCC)   Diastolic dysfunction with chronic heart failure (HCC)   Essential hypertension   Iron deficiency anemia   Thrombocytopenia due to hypersplenism  Time spent:   Irwin Brakeman, MD, FAAFP [facility] Pager [phone]  If 7PM-7AM, please contact night-coverage [email or web address] Password [email or web address] 04/06/2016, 8:24 AM    LOS: 0 days

## 2016-04-06 NOTE — Progress Notes (Signed)
   04/06/16 1400  Clinical Encounter Type  Visited With Patient and family together  Visit Type Spiritual support  Referral From Nurse  Consult/Referral To Chaplain  Spiritual Encounters  Spiritual Needs Prayer;Emotional  [facility] responded to [facility] consult; visited with patient and niece.  Offered prayer and emotional support.

## 2016-04-07 LAB — GLUCOSE, CAPILLARY
GLUCOSE-CAPILLARY: 125 mg/dL — AB (ref 65–99)
GLUCOSE-CAPILLARY: 142 mg/dL — AB (ref 65–99)
Glucose-Capillary: 109 mg/dL — ABNORMAL HIGH (ref 65–99)
Glucose-Capillary: 141 mg/dL — ABNORMAL HIGH (ref 65–99)

## 2016-04-07 LAB — BASIC METABOLIC PANEL
Anion gap: 8 (ref 5–15)
BUN: 52 mg/dL — AB (ref 6–20)
CALCIUM: 8.6 mg/dL — AB (ref 8.9–10.3)
CO2: 23 mmol/L (ref 22–32)
CREATININE: 2.93 mg/dL — AB (ref 0.44–1.00)
Chloride: 107 mmol/L (ref 101–111)
GFR, EST AFRICAN AMERICAN: 16 mL/min — AB (ref >60)
GFR, EST NON AFRICAN AMERICAN: 14 mL/min — AB (ref >60)
Glucose, Bld: 118 mg/dL — ABNORMAL HIGH (ref 65–99)
Potassium: 3.5 mmol/L (ref 3.5–5.1)
SODIUM: 138 mmol/L (ref 135–145)

## 2016-04-07 NOTE — Progress Notes (Signed)
PROGRESS NOTE    Cynthia Stanton  [id number]  DOB: 1934-11-11  DOA: 04/04/2016 PCP: [facility]:   Hospital course: Cynthia Stanton is a 81 y.o. 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  Assessment & Plan:   1. Hyperkalemia: AM BMP pending.  Will follow and treat as needed.   2. Nausea/vomiting, abdominal cramps and diarrhea: Symptoms much improved this morning per patient Unclear of true cause, suspect underlying diabetic gastroparesis.  The patient seems to say that this is chronic, recurrent and episodic.  Considerations then include gastroparesis possibly, gastritis, hyperammonemia or severe GERD.    No reported hematemesis or melena  - Given her worsening symptoms I have asked for GI consultation for opinion.  Pt was placed on full liquid diet and did well, advancing to gastroparesis type diet per GI recs.  Pharmacist reviewed her meds and wasn't impressed that any of them can specifically be causing her symptoms.    3. CKD III:  BUN/Cr elevated but at baseline from recent tests from [facility] system reviewed records care everywhere.    4. Anemia of renal disease and iron def:  On iron infusions with her Heme-Onc. -Holding oral iron  5. NIDDM:  Diet controlled CBG (last 3)   Recent Labs  04/06/16 1641 04/06/16 2223 04/07/16 0722  GLUCAP 120* 109* 125*    6. HTN: controlled on home meds -Continue hydralazine, labetalol, Imdur  7. Mood:  -Continue bupropion, Zoloft -Continue alprazolam PRN  8. Cirrhosis of the liver: - Pt followed by hepatologist at [facility] and reports that she had been taken off lactulose  9. Chronic diastolic CHF:  Appears compensated at this time.    10. Thrombocytopenia: From cirrhosis, portal HTN.   Stable.  DVT prophylaxis: SCDs  Code Status: FULL  Family Communication: neice at bedside and on phone I spoke with another family member per patient request  Disposition Plan: Home in 1-2 days and will need to follow up with Dr Dorrene German Consults called: [facility]   Subjective: Pt reports that she is feeling better.      Objective: Vitals:   04/06/16 0853 04/06/16 1429 04/06/16 2226 04/07/16 0508  BP: (!) 133/43 113/61 (!) 121/35 (!) 138/37  Pulse: 67 84 79 80  Resp: 18 16 16  (!) 169  Temp: 98.9 F (37.2 C) 98.6 F (37 C) 98.9 F (37.2 C) 99.7 F (37.6 C)  TempSrc: Oral Oral Oral Oral  SpO2: 99% 97% 100% 96%  Weight:      Height:        Intake/Output Summary (Last 24 hours) at 04/07/16 0750 Last data filed at 04/07/16 0600  Gross per 24 hour  Intake          2386.67 ml  Output              250 ml  Net          2136.67 ml   Filed Weights   04/04/16 2047 04/05/16 0515  Weight: 69.5 kg (153 lb 3 oz) 71.3 kg (157 lb 3.2 oz)    Exam:  General appearance: Well-developed, elderly adult female, alert and in no acute distress.   Eyes: Mildly icteric, conjunctiva pink, lids and lashes normal. PERRL.    ENT: No nasal deformity, discharge, epistaxis.  Hearing normal. OP moist without lesions.  Neck: No neck masses.  Trachea midline.  No thyromegaly/tenderness. Skin: Warm and dry.  No jaundice.  No suspicious rashes or lesions. Cardiac: RRR, nl S1-S2, no murmurs appreciated.  Capillary refill is brisk.  JVP normal.  No LE edema.  Radial and DP pulses 2+ and symmetric. Respiratory: Normal respiratory rate and rhythm.  CTAB without rales or wheezes. Abdomen: Abdomen soft.  No TTP, no epigastric pain at all, no rebound or guarding. No ascites, distension, hepatosplenomegaly.   MSK: No deformities or effusions.  No cyanosis or clubbing. Neuro: Cranial nerves normal.  Sensation intact to light touch. Speech is fluent.  Muscle strength normal and symmetric.  Mild intention tremor.  Psych: Sensorium intact and responding to questions, alert, oriented to person, place and time, attention normal.   Data Reviewed: Basic Metabolic Panel:  Recent Labs Lab 04/04/16 2005 04/05/16 0559 04/05/16 1258 04/06/16 0537  NA 136 137 136 138  K 6.1* 5.4* 4.8 5.5*  CL 113* 118* 116* 119*  CO2 15* 13* 13* 14*  GLUCOSE 190* 136* 217* 128*  BUN 55* 52* 55* 59*  CREATININE 1.94* 1.80* 1.99* 2.65*  CALCIUM 10.0 9.5 9.2 9.2   Liver Function Tests:  Recent Labs Lab 04/04/16 2005  AST 30  ALT 21  ALKPHOS 88  BILITOT 0.7  PROT 7.5  ALBUMIN 4.4    Recent Labs Lab 04/04/16 2005  LIPASE 52*    Recent Labs Lab 04/05/16 0559  AMMONIA 53*   CBC:  Recent Labs Lab 04/04/16 2005 04/05/16 0559  WBC 7.8 5.9  NEUTROABS 7.0 4.5  HGB 10.2* 9.1*  HCT 30.4* 27.3*  MCV 95.3 94.5  PLT 104* 83*   Cardiac Enzymes: No results for input(s): CKTOTAL, CKMB, CKMBINDEX, TROPONINI in the last 168 hours. CBG (last 3)   Recent Labs  04/06/16 1641 04/06/16 2223 04/07/16 0722  GLUCAP 120* 109* 125*   Recent Results (from the past 240 hour(s))  Gastrointestinal Panel by PCR , Stool     Status: None   Collection Time: 04/05/16 10:09 AM  Result Value Ref Range Status   Campylobacter species NOT DETECTED NOT DETECTED Final   Plesimonas shigelloides NOT DETECTED NOT DETECTED Final   Salmonella species NOT DETECTED NOT DETECTED Final   Yersinia enterocolitica NOT DETECTED NOT DETECTED Final   Vibrio species NOT DETECTED NOT DETECTED Final   Vibrio cholerae NOT DETECTED NOT DETECTED Final   Enteroaggregative E coli (EAEC) NOT DETECTED NOT DETECTED Final   Enteropathogenic E coli (EPEC) NOT DETECTED NOT DETECTED Final   Enterotoxigenic E coli (ETEC) NOT DETECTED NOT DETECTED Final   Shiga like toxin producing E coli (STEC) NOT DETECTED NOT DETECTED Final   E. coli O157 NOT DETECTED NOT DETECTED Final   Shigella/Enteroinvasive E coli (EIEC) NOT DETECTED NOT DETECTED Final    Cryptosporidium NOT DETECTED NOT DETECTED Final   Cyclospora cayetanensis NOT DETECTED NOT DETECTED Final   Entamoeba histolytica NOT DETECTED NOT DETECTED Final   Giardia lamblia NOT DETECTED NOT DETECTED Final   Adenovirus F40/41 NOT DETECTED NOT DETECTED Final   Astrovirus NOT DETECTED NOT DETECTED Final   Norovirus GI/GII NOT DETECTED NOT DETECTED Final   Rotavirus A NOT DETECTED NOT DETECTED Final   Sapovirus (I, II, IV, and V) NOT DETECTED NOT DETECTED Final     Studies: No results found. Scheduled Meds: . allopurinol  200 mg Oral Daily  . buPROPion  150 mg Oral Daily  . feeding supplement (ENSURE ENLIVE)  237 mL Oral BID BM  .  folic acid  1 mg Oral Daily  . hydrALAZINE  100 mg Oral Q8H  . insulin aspart  0-5 Units Subcutaneous QHS  . insulin aspart  0-9 Units Subcutaneous TID WC  . isosorbide mononitrate  120 mg Oral Daily  . labetalol  400 mg Oral BID  . ondansetron (ZOFRAN) IV  4 mg Intravenous Q6H  . pantoprazole  40 mg Oral BID  . pravastatin  80 mg Oral Daily  . saccharomyces boulardii  250 mg Oral BID  . sertraline  100 mg Oral Daily   Continuous Infusions: .  sodium bicarbonate 150 mEq in sterile water 1000 mL infusion 100 mL/hr at 04/06/16 2110   Principal Problem:   Hyperkalemia Active Problems:   Nausea, vomiting and diarrhea   Abdominal pain   Adjustment disorder with depressed mood   Chronic kidney disease, stage III (moderate)   Cirrhosis of liver (HCC)   Depression   Diabetes mellitus with neurological manifestations (HCC)   Diabetic polyneuropathy associated with type 2 diabetes mellitus (HCC)   Diastolic dysfunction with chronic heart failure (HCC)   Essential hypertension   Iron deficiency anemia   Thrombocytopenia due to hypersplenism   Nausea vomiting and diarrhea  Time spent:   Irwin Brakeman, MD, FAAFP [facility] Pager [phone]  If 7PM-7AM, please contact night-coverage [email or web address] Password [email or web address] 04/07/2016, 7:50 AM      LOS: 1 day

## 2016-04-07 NOTE — Progress Notes (Signed)
GASTROENTEROLOGY PROGRESS NOTE  Problem:   Nausea and vomiting, probably a consequence of gastric dysmotility due to diabetes and underlying medical illness (azotemia).  Subjective: Patient vomited her first attempted eating yesterday, and had dry heaves this morning, but had chicken and chicken soup for lunch without nausea or vomiting, and is free of nausea at present other than occasional transient twinges.  Objective: No acute distress. Bowel sounds are quiet, no succussion splash. No overt abdominal tenderness. Potassium is 3.5, estimated GFR is 14.  Assessment: Somewhat improved. Apparently this pattern of periodic vomiting is not unusual for the patient.  Plan: The patient was counseled to eat frequent small amounts of food. If this is not tolerated (in other words, if vomiting recurs during the hospitalization), I would go back to a liquid diet, but for now I am optimistic she will be able to tolerate solid food in adequate amounts, and be discharged in a day or 2.  Cleotis Nipper, M.D. 04/07/2016 6:16 PM  Pager [phone] If no answer or after 5 PM call [phone]

## 2016-04-08 DIAGNOSIS — F4321 Adjustment disorder with depressed mood: Secondary | ICD-10-CM

## 2016-04-08 LAB — BASIC METABOLIC PANEL
ANION GAP: 9 (ref 5–15)
BUN: 51 mg/dL — AB (ref 6–20)
CHLORIDE: 104 mmol/L (ref 101–111)
CO2: 28 mmol/L (ref 22–32)
Calcium: 8.7 mg/dL — ABNORMAL LOW (ref 8.9–10.3)
Creatinine, Ser: 2.69 mg/dL — ABNORMAL HIGH (ref 0.44–1.00)
GFR calc Af Amer: 18 mL/min — ABNORMAL LOW (ref >60)
GFR calc non Af Amer: 16 mL/min — ABNORMAL LOW (ref >60)
GLUCOSE: 122 mg/dL — AB (ref 65–99)
POTASSIUM: 3.4 mmol/L — AB (ref 3.5–5.1)
SODIUM: 141 mmol/L (ref 135–145)

## 2016-04-08 LAB — GLUCOSE, CAPILLARY
GLUCOSE-CAPILLARY: 121 mg/dL — AB (ref 65–99)
Glucose-Capillary: 135 mg/dL — ABNORMAL HIGH (ref 65–99)

## 2016-04-08 MED ORDER — ALLOPURINOL 100 MG PO TABS: 200.0000 mg | ORAL_TABLET | Freq: Every day | ORAL | 0 refills | Status: AC

## 2016-04-08 MED ORDER — SACCHAROMYCES BOULARDII 250 MG PO CAPS: 250.0000 mg | ORAL_CAPSULE | Freq: Two times a day (BID) | ORAL | 0 refills | Status: AC

## 2016-04-08 MED ORDER — POTASSIUM CHLORIDE CRYS ER 10 MEQ PO TBCR
10.0000 meq | EXTENDED_RELEASE_TABLET | Freq: Once | ORAL | Status: AC
  Administered 2016-04-08: 10 meq via ORAL
  Filled 2016-04-08: qty 1

## 2016-04-08 NOTE — Discharge Summary (Signed)
Physician Discharge Summary  Cynthia Stanton [id number] DOB: [birth year] DOA: 04/04/2016  PCP: Ardith Dark, PA-C  Admit date: 04/04/2016 Discharge date: 04/08/2016  Admitted From: Home Disposition:  HOME  Recommendations for Outpatient Follow-up:  1. Follow up with PCP in 1 weeks 2. Please follow up with GI Dr Dorrene German in 2 weeks 3. Please obtain BMP/CBC in one week 4. Please consider outpatient gastric emptying study   Discharge Condition: STABLE CODE STATUS: FULL Diet recommendation: Small frequent meals  minimize use of fibrous breads/meats/vegetables and greasy foods  Brief/Interim Summary: Hospital course: Cynthia Stanton a 81 y.o.femalewith a past medical history significant for NASH with cirrhosis, HE and GAVE, CKD baseline Cr 2.2, HTN and anemia of CKD/IDA with thrombocytopenia from hypersplenismwho presents with vomiting and weakness and found to have K 6.1.  Assessment & Plan:   1. Hyperkalemia:resolved.  Will follow and treat as needed.   2. Nausea/vomiting, abdominal cramps and diarrhea:Symptoms much improved with gastroparesis diet.  Pt tolerated it very well prior to discharge.  Unclear of true cause, suspect underlying diabetic gastroparesis. The patient seems to say that this is chronic, recurrent and episodic. Considerations then include gastroparesis possibly, gastritis, hyperammonemia or severe GERD.  No reported hematemesis or melena  - Given her worsening symptoms I have asked for GI consultation for opinion.  Pt tolerated full liquid diet and then advanced to gastroparesis type diet.    3. CKD III: BUN/Cr at baseline from recent tests from [facility] system reviewed records care everywhere.    4. Anemia of renal disease and iron def: On iron infusions with her Heme-Onc. Resume home oral iron.   5. NIDDM: Diet controlled CBG (last 3)   Recent Labs (last 2 labs)    Recent Labs  04/07/16 1709 04/07/16 2139 04/08/16 0727  GLUCAP  109* 141* 121*      6. [id number] on home meds -Continue hydralazine, labetalol, Imdur  7. Mood: -Continue home meds -Continue alprazolam PRN  8. Cirrhosis of the liver: - Pt followed by hepatologist at [facility] and reports that she had been taken off lactulose.  Resume home diuretics.   9. Chronic diastolic CHF:  Appears compensated at this time.    10. Thrombocytopenia: From cirrhosis, portal HTN. Stable.  DVT prophylaxis:SCDs Code Status:FULL Family Communication:neice at bedside and on phone I spoke with another family member per patient request Disposition Plan:Home in 1-2 days and will need to follow up with Dr Dorrene German Consults called:[facility]    Discharge Diagnoses:  Principal Problem:   Hyperkalemia Active Problems:   Nausea, vomiting and diarrhea   Abdominal pain   Adjustment disorder with depressed mood   Chronic kidney disease, stage III (moderate)   Cirrhosis of liver (HCC)   Depression   Diabetes mellitus with neurological manifestations ([place])   Diabetic polyneuropathy associated with type 2 diabetes mellitus (HCC)   Diastolic dysfunction with chronic heart failure (HCC)   Essential hypertension   Iron deficiency anemia   Thrombocytopenia due to hypersplenism   Nausea vomiting and diarrhea  Discharge Instructions    Medication List    STOP taking these medications   lactulose 10 g packet Commonly known as:  CEPHULAC   milk thistle 175 MG tablet   ondansetron 4 MG disintegrating tablet Commonly known as:  ZOFRAN-ODT     TAKE these medications   allopurinol 100 MG tablet Commonly known as:  ZYLOPRIM Take 2 tablets (200 mg total) by mouth daily. What changed:  medication strength  how much to take  ALPRAZolam 0.25 MG tablet Commonly known as:  XANAX Take 0.25 mg by mouth daily as needed.   buPROPion 150 MG 24 hr tablet Commonly known as:  WELLBUTRIN XL Take 150 mg by mouth daily.   calcium carbonate 600 MG Tabs  tablet Commonly known as:  OS-CAL Take 600 mg by mouth 2 (two) times daily with a meal.   clobetasol cream 0.05 % Commonly known as:  TEMOVATE Apply 1 application topically 2 (two) times daily.   ferrous sulfate 325 (65 FE) MG tablet Take 325 mg by mouth daily.   fluticasone 50 MCG/ACT nasal spray Commonly known as:  FLONASE Place into both nostrils daily.   folic acid 1 MG tablet Commonly known as:  FOLVITE Take 1 mg by mouth daily.   furosemide 20 MG tablet Commonly known as:  LASIX Take 20 mg by mouth.   hydrALAZINE 100 MG tablet Commonly known as:  APRESOLINE Take 100 mg by mouth every 8 (eight) hours.   isosorbide mononitrate 120 MG 24 hr tablet Commonly known as:  IMDUR Take 120 mg by mouth daily.   labetalol 200 MG tablet Commonly known as:  NORMODYNE Take 400 mg by mouth 2 (two) times daily.   multivitamin capsule Take 1 capsule by mouth daily.   pantoprazole 40 MG tablet Commonly known as:  PROTONIX Take 40 mg by mouth daily.   pravastatin 80 MG tablet Commonly known as:  PRAVACHOL Take 80 mg by mouth daily.   saccharomyces boulardii 250 MG capsule Commonly known as:  FLORASTOR Take 1 capsule (250 mg total) by mouth 2 (two) times daily.   sertraline 100 MG tablet Commonly known as:  ZOLOFT Take 100 mg by mouth daily.   spironolactone 25 MG tablet Commonly known as:  ALDACTONE Take 25 mg by mouth daily.      Follow-up Information    Alger Memos, MD. Schedule an appointment as soon as possible for a visit in 2 week(s).   Specialty:  Gastroenterology Why:  Hospital Follow Up  Contact information: [address] [place] [address] [phone]        HEDGECOCK,SUZANNE, [place]. Schedule an appointment as soon as possible for a visit in 5 day(s).   Specialty:  Physician Assistant Why:  Hospital Follow Up  Contact information: [address] [facility] [place] [address] [phone]           Allergies  Allergen Reactions  . Amlodipine Swelling and Other (See Comments)    Other Reaction: Other reaction Bilateral legs Other Reaction: Other reaction Bilateral legs  . Bee Venom Swelling    LEGS AND ANKLES  . Benazepril Swelling  . Prednisone Shortness Of Breath and Other (See Comments)    Other Reaction: Other reaction Dyspnea, Edema Dyspnea, Edema  . Promethazine Other (See Comments)    Other Reaction: Other reaction Other Reaction: Other reaction Unknown Unknown  . Sulfa Antibiotics Other (See Comments)    Uncoded Allergy. Allergen: Lansoprazole Powd, Other Reaction: Other reaction  . Amlodipine Besy-Benazepril Hcl Swelling  . Lansoprazole Nausea Only  . Nifedipine Swelling  . Norvasc [Amlodipine Besylate] Swelling  . Biaxin [Clarithromycin] Rash    unknown  . Erythromycin Rash  . Flagyl [Metronidazole] Rash  . Omeprazole Magnesium Nausea Only    Procedures/Studies: Ct Abdomen Pelvis Wo Contrast  Result Date: 04/05/2016 CLINICAL DATA:  Abdominal pain.  Nausea, vomiting, diarrhea. EXAM: CT ABDOMEN AND PELVIS WITHOUT CONTRAST TECHNIQUE: Multidetector CT imaging of the abdomen and  pelvis was performed following the standard protocol without IV contrast. COMPARISON:  CT 12/16/2015, PET CT 01/03/2016 FINDINGS: Lower chest: Irregular right lower lobe pulmonary nodule measures 7 x 8 mm, unchanged in size from prior, margins are irregular. No pleural fluid. Hepatobiliary: Nodular hepatic contours consistent with cirrhosis. No evidence of focal lesion allowing for lack contrast. Postcholecystectomy, no biliary dilatation. Pancreas: No ductal dilatation or inflammation. Spleen: Enlarged measuring 16.3 cm craniocaudal. Adrenals/Urinary Tract: No adrenal nodule. No hydronephrosis. Right renal cyst is again seen. Question of cyst in the upper left kidney, incompletely characterized without contrast. Bladder is decompressed. Stomach/Bowel: Enteric contrast in the distal  esophagus. Unchanged prominent gastric mucosa no small bowel inflammation or obstruction. Colonic diverticulosis most prominent in the descending and sigmoid colon without acute diverticulitis post appendectomy. Vascular/Lymphatic: Abdominal atherosclerosis without aneurysm. No evidence of intra-abdominal or pelvic adenopathy. Reproductive: Status post hysterectomy. No adnexal masses. Other: Minimal perihepatic fluid about the right lobe anteriorly, and decreased from prior. Otherwise no ascites. No free air. Musculoskeletal: There are no acute or suspicious osseous abnormalities. Degenerative change in the spine. IMPRESSION: 1. No acute abnormality in the abdomen/pelvis. 2. Cirrhosis with splenomegaly. 3. Colonic diverticulosis without acute diverticulitis. 4. Unchanged size of irregular right lower lobe pulmonary nodule measuring 7 x 8 mm. Recommendations as per prior PET. Electronically Signed   By: Jeb Levering M.D.   On: 04/05/2016 01:33     Subjective: Pt tolerating gastroparesis diet with no vomiting, had some mild nausea.  Abd pain completely resolved.  Discharge Exam: Vitals:   04/07/16 2159 04/08/16 0603  BP: (!) 137/40 (!) 140/50  Pulse: 78 70  Resp:  16  Temp:  98.2 F (36.8 C)   Vitals:   04/07/16 1403 04/07/16 2141 04/07/16 2159 04/08/16 0603  BP: (!) 112/48 (!) 124/47 (!) 137/40 (!) 140/50  Pulse: 76 76 78 70  Resp: 16 18  16   Temp: 98.6 F (37 C) 98.7 F (37.1 C)  98.2 F (36.8 C)  TempSrc: Oral Oral  Oral  SpO2: 97% 94%  94%  Weight:      Height:        General: Pt is alert, awake, not in acute distress Cardiovascular: RRR, S1/S2 +, no rubs, no gallops Respiratory: CTA bilaterally, no wheezing, no rhonchi Abdominal: Soft, NT, ND, bowel sounds + Extremities: no edema, no cyanosis  The results of significant diagnostics from this hospitalization (including imaging, microbiology, ancillary and laboratory) are listed below for reference.      Microbiology: Recent Results (from the past 240 hour(s))  Gastrointestinal Panel by PCR , Stool     Status: None   Collection Time: 04/05/16 10:09 AM  Result Value Ref Range Status   Campylobacter species NOT DETECTED NOT DETECTED Final   Plesimonas shigelloides NOT DETECTED NOT DETECTED Final   Salmonella species NOT DETECTED NOT DETECTED Final   Yersinia enterocolitica NOT DETECTED NOT DETECTED Final   Vibrio species NOT DETECTED NOT DETECTED Final   Vibrio cholerae NOT DETECTED NOT DETECTED Final   Enteroaggregative E coli (EAEC) NOT DETECTED NOT DETECTED Final   Enteropathogenic E coli (EPEC) NOT DETECTED NOT DETECTED Final   Enterotoxigenic E coli (ETEC) NOT DETECTED NOT DETECTED Final   Shiga like toxin producing E coli (STEC) NOT DETECTED NOT DETECTED Final   E. coli O157 NOT DETECTED NOT DETECTED Final   Shigella/Enteroinvasive E coli (EIEC) NOT DETECTED NOT DETECTED Final   Cryptosporidium NOT DETECTED NOT DETECTED Final   Cyclospora cayetanensis NOT DETECTED NOT  DETECTED Final   Entamoeba histolytica NOT DETECTED NOT DETECTED Final   Giardia lamblia NOT DETECTED NOT DETECTED Final   Adenovirus F40/41 NOT DETECTED NOT DETECTED Final   Astrovirus NOT DETECTED NOT DETECTED Final   Norovirus GI/GII NOT DETECTED NOT DETECTED Final   Rotavirus A NOT DETECTED NOT DETECTED Final   Sapovirus (I, II, IV, and V) NOT DETECTED NOT DETECTED Final     Labs: BNP (last 3 results) No results for input(s): BNP in the last 8760 hours. Basic Metabolic Panel:  Recent Labs Lab 04/05/16 0559 04/05/16 1258 04/06/16 0537 04/07/16 0749 04/08/16 0443  NA 137 136 138 138 141  K 5.4* 4.8 5.5* 3.5 3.4*  CL 118* 116* 119* 107 104  CO2 13* 13* 14* 23 28  GLUCOSE 136* 217* 128* 118* 122*  BUN 52* 55* 59* 52* 51*  CREATININE 1.80* 1.99* 2.65* 2.93* 2.69*  CALCIUM 9.5 9.2 9.2 8.6* 8.7*   Liver Function Tests:  Recent Labs Lab 04/04/16 2005  AST 30  ALT 21  ALKPHOS 88  BILITOT 0.7   PROT 7.5  ALBUMIN 4.4    Recent Labs Lab 04/04/16 2005  LIPASE 52*    Recent Labs Lab 04/05/16 0559  AMMONIA 53*   CBC:  Recent Labs Lab 04/04/16 2005 04/05/16 0559  WBC 7.8 5.9  NEUTROABS 7.0 4.5  HGB 10.2* 9.1*  HCT 30.4* 27.3*  MCV 95.3 94.5  PLT 104* 83*   Cardiac Enzymes: No results for input(s): CKTOTAL, CKMB, CKMBINDEX, TROPONINI in the last 168 hours. BNP: Invalid input(s): POCBNP CBG:  Recent Labs Lab 04/07/16 0722 04/07/16 1143 04/07/16 1709 04/07/16 2139 04/08/16 0727  GLUCAP 125* 142* 109* 141* 121*   D-Dimer No results for input(s): DDIMER in the last 72 hours. Hgb A1c No results for input(s): HGBA1C in the last 72 hours. Lipid Profile No results for input(s): CHOL, HDL, LDLCALC, TRIG, CHOLHDL, LDLDIRECT in the last 72 hours. Thyroid function studies No results for input(s): TSH, T4TOTAL, T3FREE, THYROIDAB in the last 72 hours.  Invalid input(s): FREET3 Anemia work up No results for input(s): VITAMINB12, FOLATE, FERRITIN, TIBC, IRON, RETICCTPCT in the last 72 hours. Urinalysis    Component Value Date/Time   COLORURINE YELLOW 04/05/2016 0010   APPEARANCEUR CLOUDY (A) 04/05/2016 0010   LABSPEC 1.017 04/05/2016 0010   PHURINE 5.0 04/05/2016 0010   GLUCOSEU NEGATIVE 04/05/2016 0010   HGBUR NEGATIVE 04/05/2016 0010   BILIRUBINUR SMALL (A) 04/05/2016 0010   KETONESUR NEGATIVE 04/05/2016 0010   PROTEINUR 100 (A) 04/05/2016 0010   NITRITE NEGATIVE 04/05/2016 0010   LEUKOCYTESUR NEGATIVE 04/05/2016 0010   Sepsis Labs Invalid input(s): PROCALCITONIN,  WBC,  LACTICIDVEN Microbiology Recent Results (from the past 240 hour(s))  Gastrointestinal Panel by PCR , Stool     Status: None   Collection Time: 04/05/16 10:09 AM  Result Value Ref Range Status   Campylobacter species NOT DETECTED NOT DETECTED Final   Plesimonas shigelloides NOT DETECTED NOT DETECTED Final   Salmonella species NOT DETECTED NOT DETECTED Final   Yersinia  enterocolitica NOT DETECTED NOT DETECTED Final   Vibrio species NOT DETECTED NOT DETECTED Final   Vibrio cholerae NOT DETECTED NOT DETECTED Final   Enteroaggregative E coli (EAEC) NOT DETECTED NOT DETECTED Final   Enteropathogenic E coli (EPEC) NOT DETECTED NOT DETECTED Final   Enterotoxigenic E coli (ETEC) NOT DETECTED NOT DETECTED Final   Shiga like toxin producing E coli (STEC) NOT DETECTED NOT DETECTED Final   E. coli O157 NOT DETECTED  NOT DETECTED Final   Shigella/Enteroinvasive E coli (EIEC) NOT DETECTED NOT DETECTED Final   Cryptosporidium NOT DETECTED NOT DETECTED Final   Cyclospora cayetanensis NOT DETECTED NOT DETECTED Final   Entamoeba histolytica NOT DETECTED NOT DETECTED Final   Giardia lamblia NOT DETECTED NOT DETECTED Final   Adenovirus F40/41 NOT DETECTED NOT DETECTED Final   Astrovirus NOT DETECTED NOT DETECTED Final   Norovirus GI/GII NOT DETECTED NOT DETECTED Final   Rotavirus A NOT DETECTED NOT DETECTED Final   Sapovirus (I, II, IV, and V) NOT DETECTED NOT DETECTED Final   Time coordinating discharge: 27 minutes  SIGNED:  Irwin Brakeman, MD  [facility] 04/08/2016, 10:46 AM Pager   If 7PM-7AM, please contact night-coverage [email or web address] Password [email or web address]

## 2016-04-08 NOTE — Care Management Important Message (Signed)
Important Message  Patient Details  Name: Cynthia Stanton MRN: [id number] Date of Birth: 11/22/1934   Medicare Important Message Given:  Yes    Erenest Rasher, RN 04/08/2016, 2:22 PM

## 2016-04-08 NOTE — Care Management Note (Signed)
Case Management Note  Patient Details  Name: Cynthia Stanton MRN: [id number] Date of Birth: 01-19-1935  Subjective/Objective:    Hyperkalemia, CKD, N/V                Action/Plan: Discharge Planning: AVS reviewed: NCM spoke to pt and niece, Flo at bedside. Pt lives alone in the home. Niece was concerned that pt will need more assistance at home. Pt states she believes she has a [employer] that she purchased years ago that will pay for an aide. Offered choice for HH/provided list. Pt agreeable to [facility] for [facility]. Contacted [facility] DME rep for RW and 3n1 for home. Will deliver to room prior to dc.   PCP -Ardith Dark MD- faxed dc summary to PCP's office per pt's request.   Expected Discharge Date:  04/08/2016            Expected Discharge Plan:  [place]  In-House Referral:  NA  Discharge planning Services  CM Consult  Post Acute Care Choice:  Home Health Choice offered to:  Patient  DME Arranged:  3-N-1, [place] rolling DME Agency:  [place]:  RN, PT, Nurse's Aide [place] Agency:  [place]  Status of Service:  Completed, signed off  If discussed at [place] of Stay Meetings, dates discussed:    Additional Comments:  Erenest Rasher, RN 04/08/2016, 2:25 PM

## 2016-04-08 NOTE — Discharge Instructions (Signed)
NEW DIET INSTRUCTIONS: (small, frequent meals; minimize use of fibrous breads/meats/vegetables and greasy foods)    Gastroparesis Gastroparesis, also called delayed gastric emptying, is a condition in which food takes longer than normal to empty from the stomach. The condition is usually long-lasting (chronic). CAUSES This condition may be caused by:  An endocrine disorder, such as hypothyroidism or diabetes. Diabetes is the most common cause of this condition.  A nervous system disease, such as Parkinson disease or multiple sclerosis.  Cancer, infection, or surgery of the stomach or vagus nerve.  A connective tissue disorder, such as scleroderma.  Certain medicines. In most cases, the cause is not known. RISK FACTORS This condition is more likely to develop in:  People with certain disorders, including endocrine disorders, eating disorders, amyloidosis, and scleroderma.  People with certain diseases, including Parkinson disease or multiple sclerosis.  People with cancer or infection of the stomach or vagus nerve.  People who have had surgery on the stomach or vagus nerve.  People who take certain medicines.  Women. SYMPTOMS Symptoms of this condition include:  An early feeling of fullness when eating.  Nausea.  Weight loss.  Vomiting.  Heartburn.  Abdominal bloating.  Inconsistent blood glucose levels.  Lack of appetite.  Acid from the stomach coming up into the esophagus (gastroesophageal reflux).  Spasms of the stomach. Symptoms may come and go. DIAGNOSIS This condition is diagnosed with tests, such as:  Tests that check how long it takes food to move through the stomach and intestines. These tests include:  Upper gastrointestinal (GI) series. In this test, X-rays of the intestines are taken after you drink a liquid. The liquid makes the intestines show up better on the X-rays.  Gastric emptying scintigraphy. In this test, scans are taken after you  eat food that contains a small amount of radioactive material.  Wireless capsule GI monitoring system. This test involves swallowing a capsule that records information about movement through the stomach.  Gastric manometry. This test measures electrical and muscular activity in the stomach. It is done with a thin tube that is passed down the throat and into the stomach.  Endoscopy. This test checks for abnormalities in the lining of the stomach. It is done with a long, thin tube that is passed down the throat and into the stomach.  An ultrasound. This test can help rule out gallbladder disease or pancreatitis as a cause of your symptoms. It uses sound waves to take pictures of the inside of your body. TREATMENT There is no cure for gastroparesis. This condition may be managed with:  Treatment of the underlying condition causing the gastroparesis.  Lifestyle changes, including exercise and dietary changes. Dietary changes can include:  Changes in what and when you eat.  Eating smaller meals more often.  Eating low-fat foods.  Eating low-fiber forms of high-fiber foods, such as cooked vegetables instead of raw vegetables.  Having liquid foods in place of solid foods. Liquid foods are easier to digest.  Medicines. These may be given to control nausea and vomiting and to stimulate stomach muscles.  Getting food through a feeding tube. This may be done in severe cases.  A gastric neurostimulator. This is a device that is inserted into the body with surgery. It helps improve stomach emptying and control nausea and vomiting. HOME CARE INSTRUCTIONS  Follow your health care provider's instructions about exercise and diet.  Take medicines only as directed by your health care provider. SEEK MEDICAL CARE IF:  Your symptoms do  not improve with treatment.  You have new symptoms. SEEK IMMEDIATE MEDICAL CARE IF:  You have severe abdominal pain that does not improve with treatment.  You  have nausea that does not go away.  You cannot keep fluids down.   This information is not intended to replace advice given to you by your health care provider. Make sure you discuss any questions you have with your health care provider.   Document Released: 06/11/2005 Document Revised: 10/26/2014 Document Reviewed: 06/07/2014 Elsevier Interactive Patient Education 2016 Elsevier Inc.    Dehydration, Adult Dehydration means your body does not have as much fluid or water as it needs. It happens when you take in less fluid than you lose. Your kidneys, brain, and heart will not work properly without the right amount of fluids.  Dehydration can range from mild to severe. It should be treated right away to help prevent it from becoming severe. HOME CARE  Drink enough fluid to keep your pee (urine) clear or pale yellow.  Drink water or fluid slowly by taking small sips. You can also try sucking on ice cubes.  Have food or drinks that contain electrolytes. Examples include bananas and sports drinks.  Take over-the-counter and prescription medicines only as told by your doctor.  Prepare oral rehydration solution (ORS) according to the instructions that came with it. Take sips of ORS every 5 minutes until your pee returns to normal.  If you are throwing up (vomiting) or have watery poop (diarrhea), keep trying to drink water, ORS, or both.  If you have watery poop, avoid:  Drinks with caffeine.  Fruit juice.  Milk.  Carbonated soft drinks.  Do not take salt tablets. This can lead to having too much sodium in your body (hypernatremia). GET HELP IF:  You cannot eat or drink without throwing up.  You have had mild watery poop for longer than 24 hours.  You have a fever. GET HELP RIGHT AWAY IF:   You have very strong thirst.  You have very bad watery poop.  You have not peed in 6-8 hours, or you have peed only a small amount of very dark pee.  You have shriveled skin.  You  are dizzy, confused, or both.   This information is not intended to replace advice given to you by your health care provider. Make sure you discuss any questions you have with your health care provider.   Document Released: 04/07/2009 Document Revised: 03/02/2015 Document Reviewed: 10/27/2014 Elsevier Interactive Patient Education 2016 Elsevier Inc.   Blood Glucose Monitoring, Adult Monitoring your blood glucose (also know as blood sugar) helps you to manage your diabetes. It also helps you and your health care provider monitor your diabetes and determine how well your treatment plan is working. WHY SHOULD YOU MONITOR YOUR BLOOD GLUCOSE?  It can help you understand how food, exercise, and medicine affect your blood glucose.  It allows you to know what your blood glucose is at any given moment. You can quickly tell if you are having low blood glucose (hypoglycemia) or high blood glucose (hyperglycemia).  It can help you and your health care provider know how to adjust your medicines.  It can help you understand how to manage an illness or adjust medicine for exercise. WHEN SHOULD YOU TEST? Your health care provider will help you decide how often you should check your blood glucose. This may depend on the type of diabetes you have, your diabetes control, or the types of medicines you are  taking. Be sure to write down all of your blood glucose readings so that this information can be reviewed with your health care provider. See below for examples of testing times that your health care provider may suggest. Type 1 Diabetes  Test at least 2 times per day if your diabetes is well controlled, if you are using an insulin pump, or if you perform multiple daily injections.  If your diabetes is not well controlled or if you are sick, you may need to test more often.  It is a good idea to also test:  Before every insulin injection.  Before and after exercise.  Between meals and 2 hours after a  meal.  Occasionally between 2:00 a.m. and 3:00 a.m. Type 2 Diabetes  If you are taking insulin, test at least 2 times per day. However, it is best to test before every insulin injection.  If you take medicines by mouth (orally), test 2 times a day.  If you are on a controlled diet, test once a day.  If your diabetes is not well controlled or if you are sick, you may need to monitor more often. HOW TO MONITOR YOUR BLOOD GLUCOSE Supplies Needed  Blood glucose meter.  Test strips for your meter. Each meter has its own strips. You must use the strips that go with your own meter.  A pricking needle (lancet).  A device that holds the lancet (lancing device).  A journal or log book to write down your results. Procedure  Wash your hands with soap and water. Alcohol is not preferred.  Prick the side of your finger (not the tip) with the lancet.  Gently milk the finger until a small drop of blood appears.  Follow the instructions that come with your meter for inserting the test strip, applying blood to the strip, and using your blood glucose meter. Other Areas to Get Blood for Testing Some meters allow you to use other areas of your body (other than your finger) to test your blood. These areas are called alternative sites. The most common alternative sites are:  The forearm.  The thigh.  The back area of the lower leg.  The palm of the hand. The blood flow in these areas is slower. Therefore, the blood glucose values you get may be delayed, and the numbers are different from what you would get from your fingers. Do not use alternative sites if you think you are having hypoglycemia. Your reading will not be accurate. Always use a finger if you are having hypoglycemia. Also, if you cannot feel your lows (hypoglycemia unawareness), always use your fingers for your blood glucose checks. ADDITIONAL TIPS FOR GLUCOSE MONITORING  Do not reuse lancets.  Always carry your supplies with  you.  All blood glucose meters have a 24-hour "hotline" number to call if you have questions or need help.  Adjust (calibrate) your blood glucose meter with a control solution after finishing a few boxes of strips. BLOOD GLUCOSE RECORD KEEPING It is a good idea to keep a daily record or log of your blood glucose readings. Most glucose meters, if not all, keep your glucose records stored in the meter. Some meters come with the ability to download your records to your home computer. Keeping a record of your blood glucose readings is especially helpful if you are wanting to look for patterns. Make notes to go along with the blood glucose readings because you might forget what happened at that exact time. Keeping good records  helps you and your health care provider to work together to achieve good diabetes management.    This information is not intended to replace advice given to you by your health care provider. Make sure you discuss any questions you have with your health care provider.   Document Released: 06/14/2003 Document Revised: 07/02/2014 Document Reviewed: 11/03/2012 Elsevier Interactive Patient Education [employer].

## 2016-04-08 NOTE — Progress Notes (Signed)
PROGRESS NOTE    Cynthia Stanton  [id number]  DOB: [birth year]  DOA: 04/04/2016 PCP: [facility] course: Cynthia Stanton is a 81 y.o. 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  Assessment & Plan:   1. Hyperkalemia: resolved.  Will follow and treat as needed.   2. Nausea/vomiting, abdominal cramps and diarrhea: Symptoms much improved this morning per patient Unclear of true cause, suspect underlying diabetic gastroparesis.  The patient seems to say that this is chronic, recurrent and episodic.  Considerations then include gastroparesis possibly, gastritis, hyperammonemia or severe GERD.    No reported hematemesis or melena  - Given her worsening symptoms I have asked for GI consultation for opinion.  Pt tolerated full liquid diet and then advanced to gastroparesis type diet.  Will see if she tolerates diet today and can discharge home if tolerates later today, if not then will need to stay longer and possibly have gastric emptying study tomorrow.   3. CKD III:  BUN/Cr elevated but at baseline from recent tests from [facility] system reviewed records care everywhere.    4. Anemia of renal disease and iron def:  On iron infusions with her Heme-Onc. -Holding oral iron  5. NIDDM:  Diet controlled CBG (last 3)   Recent Labs  04/07/16 1709 04/07/16 2139 04/08/16 0727  GLUCAP 109* 141* 121*    6. HTN: controlled on home meds -Continue hydralazine, labetalol, Imdur  7. Mood:  -Continue bupropion, Zoloft -Continue alprazolam PRN  8. Cirrhosis of the liver: - Pt followed by hepatologist at [facility] and reports that she had been taken off lactulose  9. Chronic diastolic CHF:  Appears compensated at this time.    10. Thrombocytopenia: From cirrhosis, portal HTN.   Stable.  DVT prophylaxis: SCDs  Code Status: FULL  Family Communication: neice at bedside and on phone I spoke with another family member per patient request  Disposition Plan: Home in 1-2 days and will need to follow up with Dr Dorrene German Consults called: [facility]   Subjective: Pt reports that she is feeling better.      Objective: Vitals:   04/07/16 1403 04/07/16 2141 04/07/16 2159 04/08/16 0603  BP: (!) 112/48 (!) 124/47 (!) 137/40 (!) 140/50  Pulse: 76 76 78 70  Resp: 16 18  16   Temp: 98.6 F (37 C) 98.7 F (37.1 C)  98.2 F (36.8 C)  TempSrc: Oral Oral  Oral  SpO2: 97% 94%  94%  Weight:      Height:        Intake/Output Summary (Last 24 hours) at 04/08/16 [id number] Last data filed at 04/08/16 0600  Gross per 24 hour  Intake             1620 ml  Output                1 ml  Net             1619 ml   Filed Weights   04/04/16 2047 04/05/16 0515  Weight: 69.5 kg (153 lb 3 oz) 71.3 kg (157 lb 3.2 oz)    Exam:  General appearance: Well-developed, elderly adult female, alert and in no acute distress.   Eyes: Mildly icteric, conjunctiva pink, lids and lashes normal. PERRL.    ENT: No nasal deformity, discharge, epistaxis.  Hearing normal. OP moist without lesions.   Neck: No neck masses.  Trachea midline.  No thyromegaly/tenderness. Skin: Warm and dry.  No jaundice.  No suspicious rashes or lesions. Cardiac: RRR, nl S1-S2, no murmurs appreciated.  Capillary refill is brisk.  JVP normal.  No LE edema.  Radial and DP pulses 2+ and symmetric. Respiratory: Normal respiratory rate and rhythm.  CTAB without rales or wheezes. Abdomen: Abdomen soft.  No TTP, no epigastric pain at all, no rebound or guarding. No ascites, distension, hepatosplenomegaly.   MSK: No deformities or effusions.  No cyanosis or clubbing. Neuro: Cranial nerves normal.  Sensation intact to light touch. Speech is fluent.  Muscle strength normal and symmetric.  Mild intention tremor. Psych: Sensorium intact and  responding to questions, alert, oriented to person, place and time, attention normal.   Data Reviewed: Basic Metabolic Panel:  Recent Labs Lab 04/05/16 0559 04/05/16 1258 04/06/16 0537 04/07/16 0749 04/08/16 0443  NA 137 136 138 138 141  K 5.4* 4.8 5.5* 3.5 3.4*  CL 118* 116* 119* 107 104  CO2 13* 13* 14* 23 28  GLUCOSE 136* 217* 128* 118* 122*  BUN 52* 55* 59* 52* 51*  CREATININE 1.80* 1.99* 2.65* 2.93* 2.69*  CALCIUM 9.5 9.2 9.2 8.6* 8.7*   Liver Function Tests:  Recent Labs Lab 04/04/16 2005  AST 30  ALT 21  ALKPHOS 88  BILITOT 0.7  PROT 7.5  ALBUMIN 4.4    Recent Labs Lab 04/04/16 2005  LIPASE 52*    Recent Labs Lab 04/05/16 0559  AMMONIA 53*   CBC:  Recent Labs Lab 04/04/16 2005 04/05/16 0559  WBC 7.8 5.9  NEUTROABS 7.0 4.5  HGB 10.2* 9.1*  HCT 30.4* 27.3*  MCV 95.3 94.5  PLT 104* 83*   Cardiac Enzymes: No results for input(s): CKTOTAL, CKMB, CKMBINDEX, TROPONINI in the last 168 hours. CBG (last 3)   Recent Labs  04/07/16 1709 04/07/16 2139 04/08/16 0727  GLUCAP 109* 141* 121*   Recent Results (from the past 240 hour(s))  Gastrointestinal Panel by PCR , Stool     Status: None   Collection Time: 04/05/16 10:09 AM  Result Value Ref Range Status   Campylobacter species NOT DETECTED NOT DETECTED Final   Plesimonas shigelloides NOT DETECTED NOT DETECTED Final   Salmonella species NOT DETECTED NOT DETECTED Final   Yersinia enterocolitica NOT DETECTED NOT DETECTED Final   Vibrio species NOT DETECTED NOT DETECTED Final   Vibrio cholerae NOT DETECTED NOT DETECTED Final   Enteroaggregative E coli (EAEC) NOT DETECTED NOT DETECTED Final   Enteropathogenic E coli (EPEC) NOT DETECTED NOT DETECTED Final   Enterotoxigenic E coli (ETEC) NOT DETECTED NOT DETECTED Final   Shiga like toxin producing E coli (STEC) NOT DETECTED NOT DETECTED Final   E. coli O157 NOT DETECTED NOT DETECTED Final   Shigella/Enteroinvasive E coli (EIEC) NOT DETECTED NOT  DETECTED Final   Cryptosporidium NOT DETECTED NOT DETECTED Final   Cyclospora cayetanensis NOT DETECTED NOT DETECTED Final   Entamoeba histolytica NOT DETECTED NOT DETECTED Final   Giardia lamblia NOT DETECTED NOT DETECTED Final   Adenovirus F40/41 NOT DETECTED NOT DETECTED Final   Astrovirus NOT DETECTED NOT DETECTED Final   Norovirus GI/GII NOT DETECTED NOT DETECTED Final   Rotavirus A NOT DETECTED NOT DETECTED Final   Sapovirus (I, II, IV, and V) NOT DETECTED NOT DETECTED Final     Studies: No results found. Scheduled Meds: . allopurinol  200 mg Oral Daily  .  buPROPion  150 mg Oral Daily  . feeding supplement (ENSURE ENLIVE)  237 mL Oral BID BM  . folic acid  1 mg Oral Daily  . hydrALAZINE  100 mg Oral Q8H  . insulin aspart  0-5 Units Subcutaneous QHS  . insulin aspart  0-9 Units Subcutaneous TID WC  . isosorbide mononitrate  120 mg Oral Daily  . labetalol  400 mg Oral BID  . ondansetron (ZOFRAN) IV  4 mg Intravenous Q6H  . pantoprazole  40 mg Oral BID  . pravastatin  80 mg Oral Daily  . saccharomyces boulardii  250 mg Oral BID  . sertraline  100 mg Oral Daily   Continuous Infusions:   Principal Problem:   Hyperkalemia Active Problems:   Nausea, vomiting and diarrhea   Abdominal pain   Adjustment disorder with depressed mood   Chronic kidney disease, stage III (moderate)   Cirrhosis of liver (HCC)   Depression   Diabetes mellitus with neurological manifestations (HCC)   Diabetic polyneuropathy associated with type 2 diabetes mellitus (HCC)   Diastolic dysfunction with chronic heart failure (HCC)   Essential hypertension   Iron deficiency anemia   Thrombocytopenia due to hypersplenism   Nausea vomiting and diarrhea  Time spent:   Irwin Brakeman, MD, FAAFP [facility] Pager [phone]  If 7PM-7AM, please contact night-coverage [email or web address] Password [email or web address] 04/08/2016, 8:07 AM    LOS: 2 days

## 2016-04-08 NOTE — Progress Notes (Signed)
Discharge orders placed for patient. Patient's niece (caregiver) then arrived to hospital with many concerns about taking patient home to live alone with no assistance. MD Hospitalist made aware. PT ordered to assess patient's need for [facility]. CM also made aware of need and stated she will come speak with niece. GI MD also spoke with niece over the phone who answered all of her questions related to GI and test results. Awaiting PT Eval and CM assessment.

## 2016-04-08 NOTE — Progress Notes (Signed)
Transient nausea, but no vomiting, after breakfast today. Also did okay with dinner last night.  Impression: Resolution of nausea and vomiting most likely related to gastric dysmotility and/or an element of uremic gastropathy.  Plan: Okay for discharge from GI tract standpoint. We will sign off. Please call me if questions. Follow-up in our office is not specifically needed, but we would be happy to see the patient back if she has ongoing symptoms.  Cleotis Nipper, M.D. Pager [phone] If no answer or after 5 PM call [phone]

## 2016-04-08 NOTE — Evaluation (Signed)
Physical Therapy Evaluation Patient Details Name: Cynthia Stanton MRN: [id number] DOB: [birth year] Today's Date: 04/08/2016   History of Present Illness  81 yo female admitted with N/V. Hx of CKD, HTN, NASH  Clinical Impression  On eval, pt required Min guard-Min assist for mobility. She walked ~150 feet in hallway. Intermittent unsteadiness noted without device so assessed gait with use of RW-some improvement noted. Family present during session-stated they are only able to provide help intermittently (a couple of days per week). Pt is generally weak and fatigues easily however this was occurring prior to admission. Encouraged pt and family to speak with CM about services available to her at home. If pt is unable to receive increased assistance in home, may need to consider SNF placement.    Follow Up Recommendations Home health PT;Supervision/Assistance - 24 hour; Home Health OT; Home Health Aide    Equipment Recommendations  Rolling walker with 5" wheels    Recommendations for Other Services       Precautions / Restrictions Precautions Precautions: Fall Restrictions Weight Bearing Restrictions: No      Mobility  Bed Mobility Overal bed mobility: Needs Assistance Bed Mobility: Supine to Sit;Sit to Supine     Supine to sit: Supervision Sit to supine: Supervision      Transfers Overall transfer level: Needs assistance   Transfers: Sit to/from Stand Sit to Stand: Min guard         General transfer comment: close guard for safety.  Ambulation/Gait Ambulation/Gait assistance: Min guard;Min assist Ambulation Distance (Feet): 150 Feet Assistive device: Rolling walker (2 wheeled);None       General Gait Details: 100' without device, 70' with RW. Stability improved a little with RW use. Pt fatigues fairly easily. Assist to stabilize intermittently  Stairs Stairs: Yes Stairs assistance: Min guard Stair Management: Step to pattern;Forwards;One rail Right Number of  Stairs: 2 General stair comments: very close guard for safety. VCs for step-to technique.   Wheelchair Mobility    Modified Rankin (Stroke Patients Only)       Balance Overall balance assessment: Needs assistance           Standing balance-Leahy Scale: Fair                               Pertinent Vitals/Pain Pain Assessment: No/denies pain    Home Living Family/patient expects to be discharged to:: Private residence Living Arrangements: Alone Available Help at Discharge: Family (checks on sometimes during the week) Type of Home: House Home Access: Stairs to enter Entrance Stairs-Rails: Right Entrance Stairs-Number of Steps: 2 Home Layout: One level Home Equipment: None      Prior Function Level of Independence: Independent               Hand Dominance        Extremity/Trunk Assessment   Upper Extremity Assessment: Generalized weakness           Lower Extremity Assessment: Generalized weakness         Communication   Communication: No difficulties  Cognition Arousal/Alertness: Awake/alert Behavior During Therapy: WFL for tasks assessed/performed Overall Cognitive Status: Within Functional Limits for tasks assessed                      General Comments      Exercises     Assessment/Plan    PT Assessment Patient needs continued PT services  PT Problem List Decreased strength;Decreased mobility;Decreased  balance;Decreased knowledge of use of DME;Decreased activity tolerance          PT Treatment Interventions DME instruction;Therapeutic activities;Therapeutic exercise;Patient/family education;Gait training;Balance training;Functional mobility training    PT Goals (Current goals can be found in the Care Plan section)  Acute Rehab PT Goals Patient Stated Goal: to get better PT Goal Formulation: With patient/family Time For Goal Achievement: 04/22/16 Potential to Achieve Goals: Good    Frequency Min 3X/week    Barriers to discharge        Co-evaluation               End of Session Equipment Utilized During Treatment: Gait belt Activity Tolerance: Patient limited by fatigue Patient left: in bed;with call bell/phone within reach;with family/visitor present           Time: [id number] PT Time Calculation (min) (ACUTE ONLY): 25 min   Charges:   PT Evaluation $PT Eval Low Complexity: 1 Procedure     PT G Codes:        Weston Anna, MPT Pager: [phone]

## 2017-04-26 IMAGING — CT CT ABD-PELV W/O CM
2 of 4 series · 16 of 46 positions shown, 18 images · non-contrast
Comparison: CT 12/16/2015, PET CT 01/03/2016

CLINICAL DATA: Abdominal pain.  Nausea, vomiting, diarrhea.

EXAM:
CT ABDOMEN AND PELVIS WITHOUT CONTRAST
TECHNIQUE: Multidetector CT imaging of the abdomen and pelvis was performed
following the standard protocol without IV contrast.

[Series 2: axial st · axial · 0.79mm/px · z∈[-488,-88]mm · 13 of 88 slices shown, 15 images]
[im 4/88  soft-tissue]
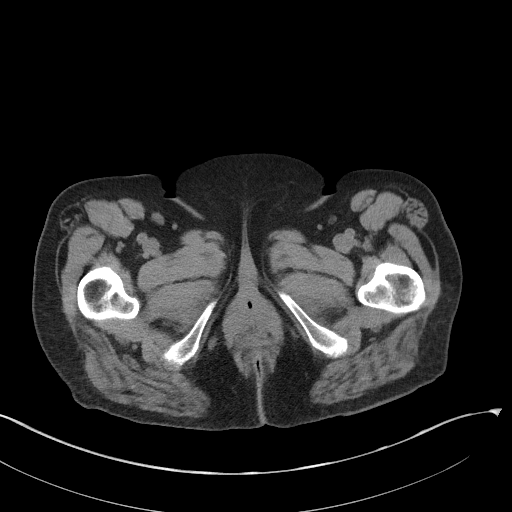
[im 4/88  bone]
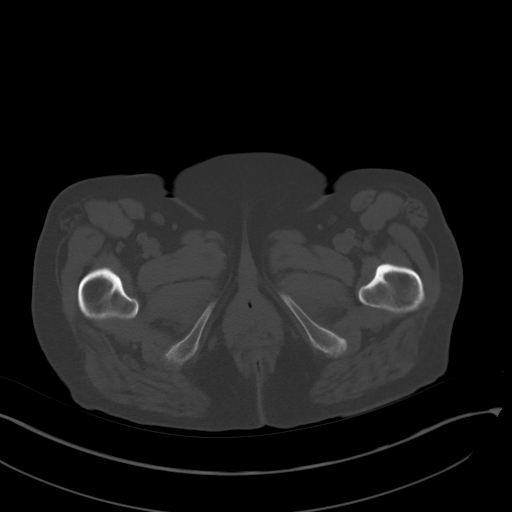
[im 11/88  soft-tissue]
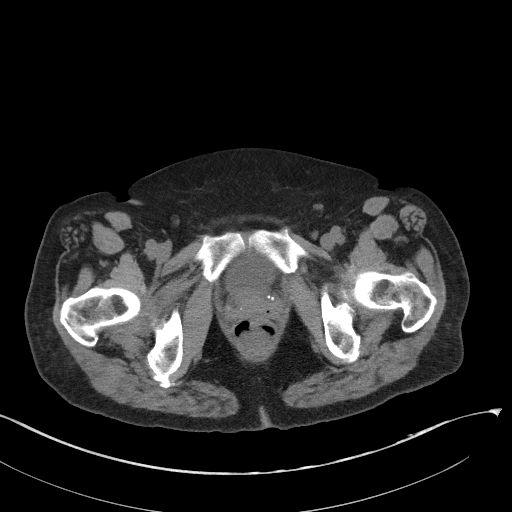
[im 18/88  soft-tissue]
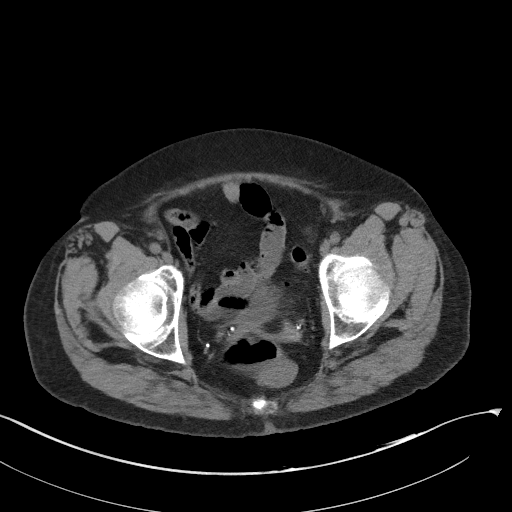
[im 25/88  soft-tissue]
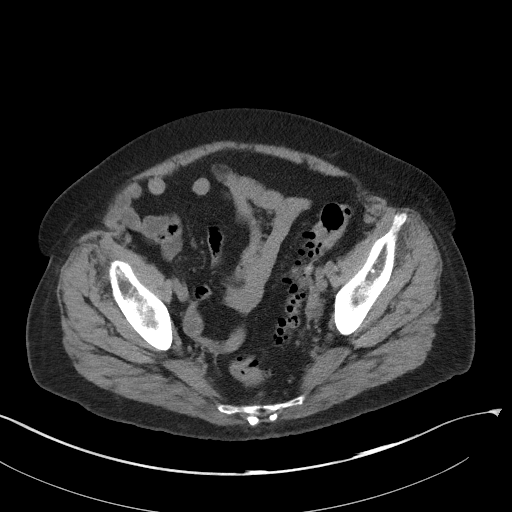
[im 32/88  soft-tissue]
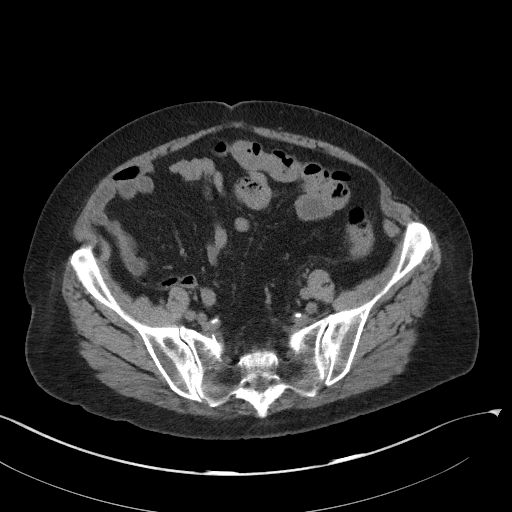
[im 39/88  soft-tissue]
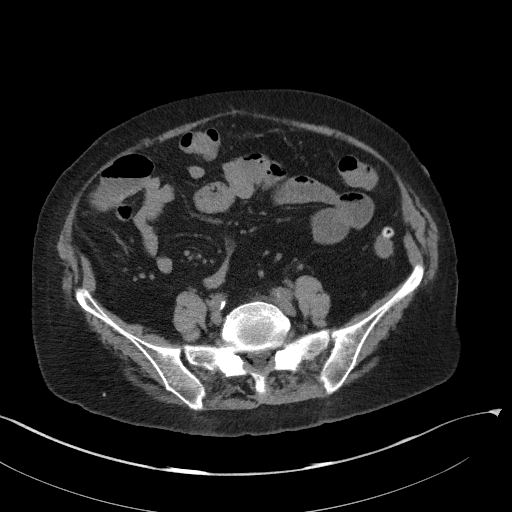
[im 46/88  soft-tissue]
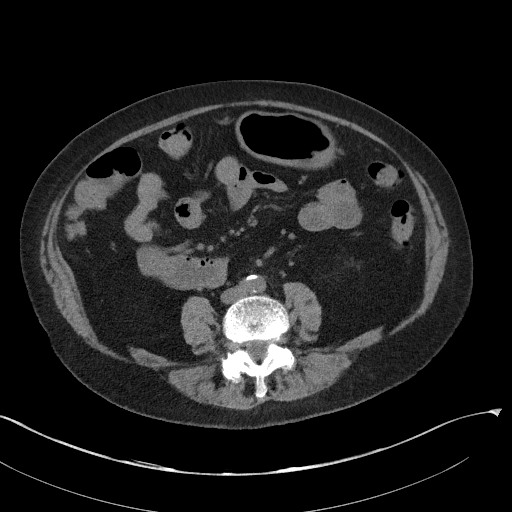
[im 49/88  soft-tissue]
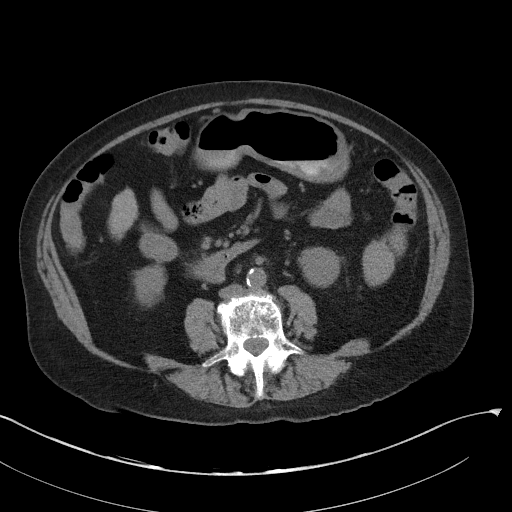
[im 56/88  soft-tissue]
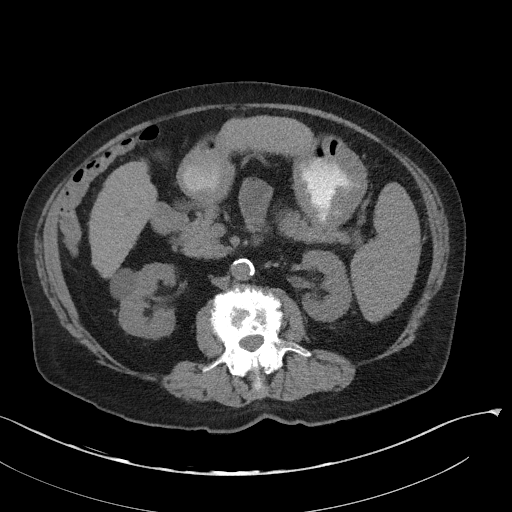
[im 56/88  bone]
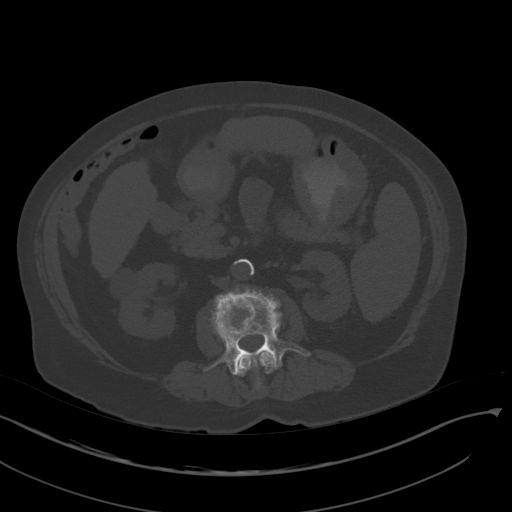
[im 63/88  soft-tissue]
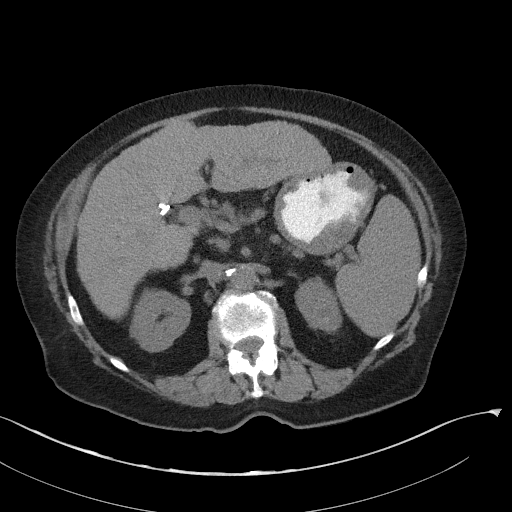
[im 70/88  soft-tissue]
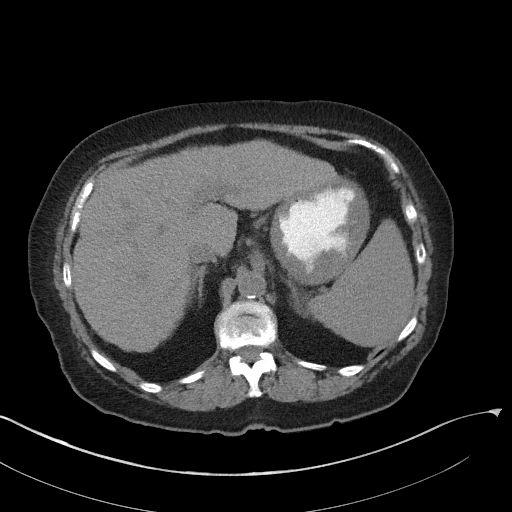
[im 77/88  soft-tissue]
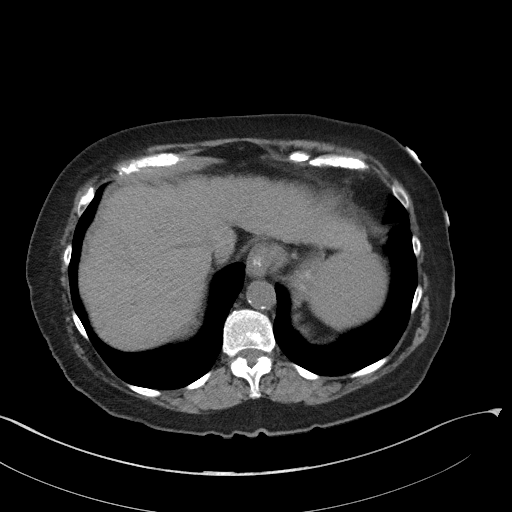
[im 84/88  soft-tissue]
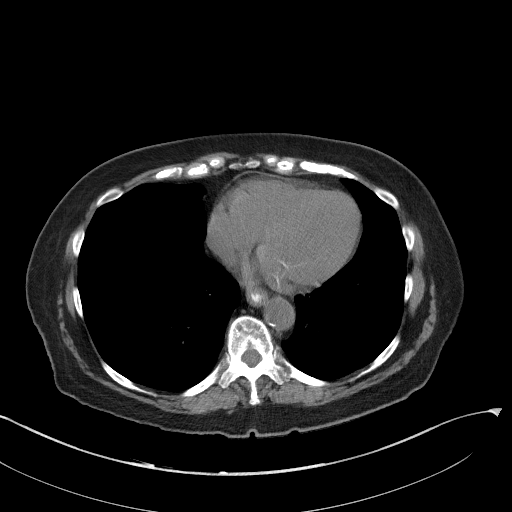

[Series 5: coronal st · coronal · 0.71mm/px · 3 of 86 slices shown]
[im 29/86  soft-tissue]
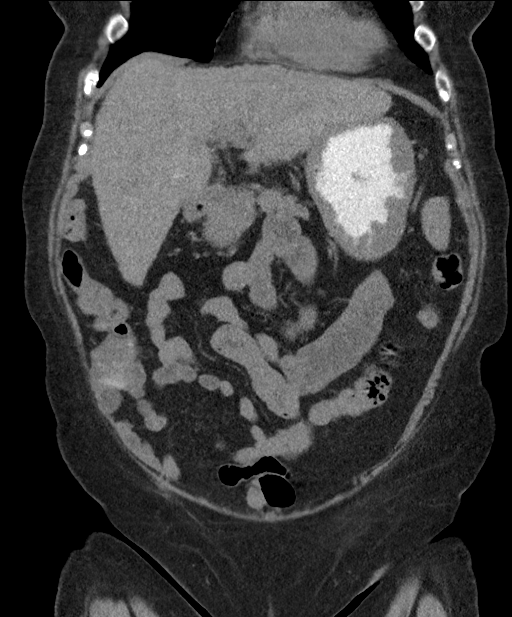
[im 38/86  soft-tissue]
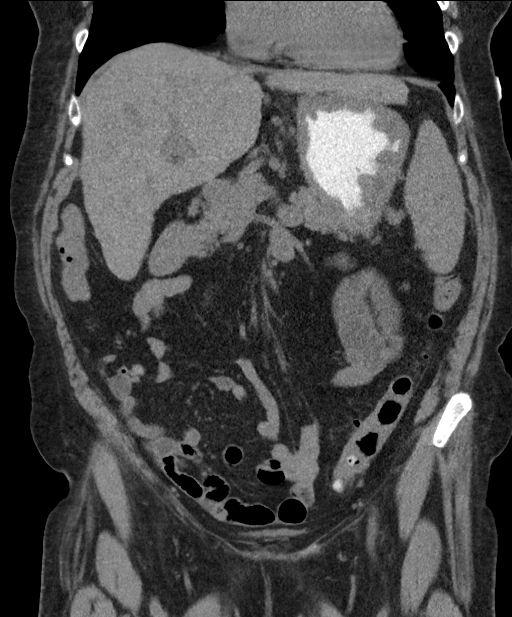
[im 48/86  soft-tissue]
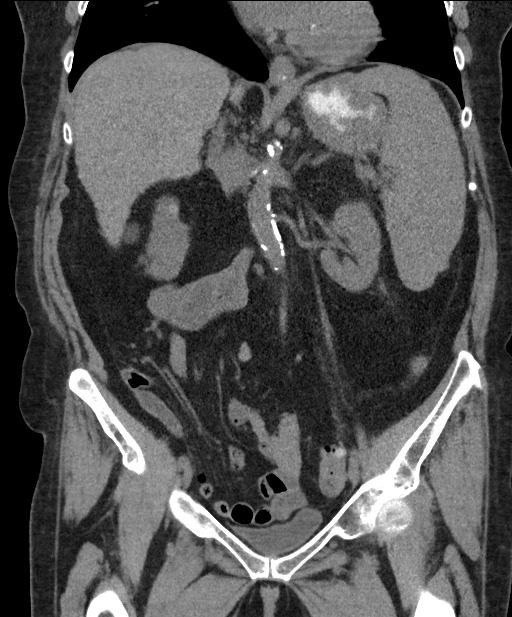

[16 of 46 positions shown; findings below may reference images not displayed]

FINDINGS: Lower chest: Irregular right lower lobe pulmonary nodule measures 7
x 8 mm, unchanged in size from prior, margins are irregular. No
pleural fluid.

Hepatobiliary: Nodular hepatic contours consistent with cirrhosis.
No evidence of focal lesion allowing for lack contrast.
Postcholecystectomy, no biliary dilatation.

Pancreas: No ductal dilatation or inflammation.

Spleen: Enlarged measuring 16.3 cm craniocaudal.

Adrenals/Urinary Tract: No adrenal nodule. No hydronephrosis. Right
renal cyst is again seen. Question of cyst in the upper left kidney,
incompletely characterized without contrast. Bladder is
decompressed.

Stomach/Bowel: Enteric contrast in the distal esophagus. Unchanged
prominent gastric mucosa no small bowel inflammation or obstruction.
Colonic diverticulosis most prominent in the descending and sigmoid
colon without acute diverticulitis post appendectomy.

Vascular/Lymphatic: Abdominal atherosclerosis without aneurysm. No
evidence of intra-abdominal or pelvic adenopathy.

Reproductive: Status post hysterectomy. No adnexal masses.

Other: Minimal perihepatic fluid about the right lobe anteriorly,
and decreased from prior. Otherwise no ascites. No free air.

Musculoskeletal: There are no acute or suspicious osseous
abnormalities. Degenerative change in the spine.
IMPRESSION: 1. No acute abnormality in the abdomen/pelvis.
2. Cirrhosis with splenomegaly.
3. Colonic diverticulosis without acute diverticulitis.
4. Unchanged size of irregular right lower lobe pulmonary nodule
measuring 7 x 8 mm. Recommendations as per prior PET.

## 2019-10-24 DEATH — deceased
